# Patient Record
Sex: Male | Born: 1980
Health system: Southern US, Community
[De-identification: ages and names within clinical notes are randomized; demographics above are authoritative.]

## PROBLEM LIST (undated history)

## (undated) DIAGNOSIS — F419 Anxiety disorder, unspecified: Secondary | ICD-10-CM

## (undated) DIAGNOSIS — M549 Dorsalgia, unspecified: Secondary | ICD-10-CM

## (undated) DIAGNOSIS — F909 Attention-deficit hyperactivity disorder, unspecified type: Secondary | ICD-10-CM

## (undated) DIAGNOSIS — K219 Gastro-esophageal reflux disease without esophagitis: Secondary | ICD-10-CM

## (undated) DIAGNOSIS — A4902 Methicillin resistant Staphylococcus aureus infection, unspecified site: Secondary | ICD-10-CM

## (undated) HISTORY — PX: GASTRIC BYPASS: SHX52

## (undated) HISTORY — PX: LAPAROSCOPIC GASTRIC BANDING: SHX1100

## (undated) HISTORY — PX: HERNIA REPAIR: SHX51

## (undated) HISTORY — PX: ABDOMINAL SURGERY: SHX537

---
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
## 2010-02-01 ENCOUNTER — Emergency Department (HOSPITAL_BASED_OUTPATIENT_CLINIC_OR_DEPARTMENT_OTHER): Admission: EM | Admit: 2010-02-01 | Discharge: 2010-02-02 | Payer: Self-pay | Admitting: Emergency Medicine

## 2012-12-11 ENCOUNTER — Encounter (HOSPITAL_BASED_OUTPATIENT_CLINIC_OR_DEPARTMENT_OTHER): Payer: Self-pay | Admitting: *Deleted

## 2012-12-11 ENCOUNTER — Emergency Department (HOSPITAL_BASED_OUTPATIENT_CLINIC_OR_DEPARTMENT_OTHER)
Admission: EM | Admit: 2012-12-11 | Discharge: 2012-12-11 | Disposition: A | Discharge status: Home / Self Care | Payer: BC Managed Care – PPO | Attending: Emergency Medicine | Admitting: Emergency Medicine

## 2012-12-11 DIAGNOSIS — IMO0001 Reserved for inherently not codable concepts without codable children: Secondary | ICD-10-CM | POA: Insufficient documentation

## 2012-12-11 DIAGNOSIS — M25559 Pain in unspecified hip: Secondary | ICD-10-CM | POA: Insufficient documentation

## 2012-12-11 DIAGNOSIS — R509 Fever, unspecified: Secondary | ICD-10-CM | POA: Insufficient documentation

## 2012-12-11 DIAGNOSIS — H53149 Visual discomfort, unspecified: Secondary | ICD-10-CM | POA: Insufficient documentation

## 2012-12-11 DIAGNOSIS — M549 Dorsalgia, unspecified: Secondary | ICD-10-CM | POA: Insufficient documentation

## 2012-12-11 DIAGNOSIS — R000 Tachycardia, unspecified: Secondary | ICD-10-CM | POA: Insufficient documentation

## 2012-12-11 DIAGNOSIS — R5381 Other malaise: Secondary | ICD-10-CM | POA: Insufficient documentation

## 2012-12-11 DIAGNOSIS — R112 Nausea with vomiting, unspecified: Secondary | ICD-10-CM | POA: Insufficient documentation

## 2012-12-11 DIAGNOSIS — R51 Headache: Secondary | ICD-10-CM | POA: Insufficient documentation

## 2012-12-11 LAB — COMPREHENSIVE METABOLIC PANEL
Albumin: 4 g/dL (ref 3.5–5.2)
BUN: 12 mg/dL (ref 6–23)
Chloride: 105 mEq/L (ref 96–112)
Creatinine, Ser: 0.9 mg/dL (ref 0.50–1.35)
GFR calc Af Amer: >90 mL/min (ref >90)
Glucose, Bld: 101 mg/dL — ABNORMAL HIGH (ref 70–99)
Total Bilirubin: 0.5 mg/dL (ref 0.3–1.2)

## 2012-12-11 LAB — CBC WITH DIFFERENTIAL/PLATELET
Basophils Absolute: 0 10*3/uL (ref 0.0–0.1)
Basophils Relative: 0 % (ref 0–1)
Eosinophils Absolute: 0 10*3/uL (ref 0.0–0.7)
Eosinophils Relative: 0 % (ref 0–5)
HCT: 49.4 % (ref 39.0–52.0)
Hemoglobin: 17.2 g/dL — ABNORMAL HIGH (ref 13.0–17.0)
Lymphocytes Relative: 5 % — ABNORMAL LOW (ref 12–46)
Lymphs Abs: 0.8 10*3/uL (ref 0.7–4.0)
MCH: 29.8 pg (ref 26.0–34.0)
MCHC: 34.8 g/dL (ref 30.0–36.0)
MCV: 85.5 fL (ref 78.0–100.0)
Monocytes Absolute: 0.5 10*3/uL (ref 0.1–1.0)
Monocytes Relative: 3 % (ref 3–12)
Neutro Abs: 14.4 10*3/uL — ABNORMAL HIGH (ref 1.7–7.7)
Neutrophils Relative %: 92 % — ABNORMAL HIGH (ref 43–77)
Platelets: 212 10*3/uL (ref 150–400)
RBC: 5.78 MIL/uL (ref 4.22–5.81)
RDW: 13.4 % (ref 11.5–15.5)
WBC: 15.7 10*3/uL — ABNORMAL HIGH (ref 4.0–10.5)

## 2012-12-11 LAB — CG4 I-STAT (LACTIC ACID): Lactic Acid, Venous: 2.67 mmol/L — ABNORMAL HIGH (ref 0.5–2.2)

## 2012-12-11 MED ORDER — ACETAMINOPHEN 500 MG PO TABS
ORAL_TABLET | ORAL | Status: AC
  Administered 2012-12-11: 1000 mg via ORAL
  Filled 2012-12-11: qty 2

## 2012-12-11 MED ORDER — ONDANSETRON HCL 4 MG/2ML IJ SOLN
4.0000 mg | Freq: Once | INTRAMUSCULAR | Status: AC
  Administered 2012-12-11: 4 mg via INTRAVENOUS
  Filled 2012-12-11: qty 2

## 2012-12-11 MED ORDER — ACETAMINOPHEN 500 MG PO TABS
1000.0000 mg | ORAL_TABLET | Freq: Once | ORAL | Status: AC
  Administered 2012-12-11: 1000 mg via ORAL

## 2012-12-11 MED ORDER — SODIUM CHLORIDE 0.9 % IV BOLUS (SEPSIS)
3000.0000 mL | Freq: Once | INTRAVENOUS | Status: AC
  Administered 2012-12-11: 3000 mL via INTRAVENOUS

## 2012-12-11 MED ORDER — ONDANSETRON HCL 4 MG/2ML IJ SOLN
INTRAMUSCULAR | Status: AC
  Administered 2012-12-11: 4 mg via INTRAVENOUS
  Filled 2012-12-11: qty 2

## 2012-12-11 NOTE — ED Notes (Signed)
1000cc NS hung to present IV site.

## 2012-12-11 NOTE — ED Notes (Signed)
Pt appears very anxious, encouraged to slow his resp rate and take slow, deep breaths.

## 2012-12-11 NOTE — ED Notes (Signed)
Pt to room 3 in w/c, able to stand and walk to bed, reports onset of hip pains, back pain, emesis x 4, and generalized body aches and headache x this am.

## 2012-12-11 NOTE — ED Provider Notes (Signed)
History     CSN: [id number]  Arrival date & time 12/11/12  1152   None     Chief Complaint  Patient presents with  . Hip Pain  . Back Pain  . Headache    (Consider location/radiation/quality/duration/timing/severity/associated sxs/prior treatment) HPI Patient developed shaking chills headache diffuse arthralgias and myalgias back pain onset 9:30 AM today. No visual changes no neck stiffness patient vomited 4 times since onset of illness he denies other complaint no abdominal pain no chest pain no urinary symptoms. He admits to generalized weakness he denies recent insect bite denies recent foreign travel no treatment prior to coming here nothing makes symptoms better or worse symptoms associated with generalized weakness. No rash History reviewed. No pertinent past medical history. Medical history negative History reviewed. No pertinent past surgical history. Surgical history lap band History reviewed. No pertinent family history.  History  Substance Use Topics  . Smoking status: Not on file  . Smokeless tobacco: Not on file  . Alcohol Use: Not on file   No tobacco no alcohol no drugs   Review of Systems  Constitutional: Positive for chills.  Eyes: Positive for photophobia.  Respiratory: Negative.   Cardiovascular: Negative.   Gastrointestinal: Positive for nausea and vomiting.  Musculoskeletal: Positive for myalgias and arthralgias.  Skin: Negative.   Neurological: Positive for headaches.  Psychiatric/Behavioral: Negative.   All other systems reviewed and are negative.    Allergies  Review of patient's allergies indicates no known allergies.  Home Medications  No current outpatient prescriptions on file.  BP 109/64  Pulse 120  Temp(Src) 98.4 F (36.9 C) (Oral)  Resp 28  SpO2 100%  Physical Exam  Nursing note and vitals reviewed. Constitutional: He is oriented to person, place, and time. He appears well-developed and well-nourished. No distress.  Mildly  ill-appearing  HENT:  Head: Normocephalic and atraumatic.  Right Ear: External ear normal.  Left Ear: External ear normal.  Bilateral tympanic membranes normal  Eyes: Conjunctivae are normal. Pupils are equal, round, and reactive to light.  Neck: Neck supple. No tracheal deviation present. No thyromegaly present.  No signs of meningitis  Cardiovascular: Regular rhythm.   No murmur heard. Tachycardic  Pulmonary/Chest: Effort normal and breath sounds normal.  Abdominal: Soft. Bowel sounds are normal. He exhibits no distension. There is no tenderness.  Genitourinary: Penis normal.  Musculoskeletal: Normal range of motion. He exhibits no edema and no tenderness.  Lymphadenopathy:    He has no cervical adenopathy.  Neurological: He is alert and oriented to person, place, and time. No cranial nerve deficit. Coordination normal.  Skin: Skin is warm and dry. No rash noted.  Psychiatric: He has a normal mood and affect.    ED Course  Procedures (including critical care time)  Labs Reviewed - No data to display No results found.   No diagnosis found. 1:50 PM patient feels much improved after treatment with intravenous fluids, Zofran and Tylenol. He is no longer nauseated. Pain is much diminished he no longer complains of photophobia. He is alert comfortable appearing, talkative  4 PM patient is alert and appropriate feels much improved only complains of minimal frontal headache Ambulates without difficulty not lightheaded on standing. Results for orders placed during the hospital encounter of 12/11/12  CBC WITH DIFFERENTIAL      Result Value Range   WBC 15.7 (*) 4.0 - 10.5 K/uL   RBC 5.78  4.22 - 5.81 MIL/uL   Hemoglobin 17.2 (*) 13.0 - 17.0 g/dL  HCT 49.4  39.0 - 52.0 %   MCV 85.5  78.0 - 100.0 fL   MCH 29.8  26.0 - 34.0 pg   MCHC 34.8  30.0 - 36.0 g/dL   RDW 16.1  09.6 - 04.5 %   Platelets 212  150 - 400 K/uL   Neutrophils Relative % 92 (*) 43 - 77 %   Neutro Abs 14.4 (*) 1.7  - 7.7 K/uL   Lymphocytes Relative 5 (*) 12 - 46 %   Lymphs Abs 0.8  0.7 - 4.0 K/uL   Monocytes Relative 3  3 - 12 %   Monocytes Absolute 0.5  0.1 - 1.0 K/uL   Eosinophils Relative 0  0 - 5 %   Eosinophils Absolute 0.0  0.0 - 0.7 K/uL   Basophils Relative 0  0 - 1 %   Basophils Absolute 0.0  0.0 - 0.1 K/uL  COMPREHENSIVE METABOLIC PANEL      Result Value Range   Sodium 139  135 - 145 mEq/L   Potassium 4.3  3.5 - 5.1 mEq/L   Chloride 105  96 - 112 mEq/L   CO2 24  19 - 32 mEq/L   Glucose, Bld 101 (*) 70 - 99 mg/dL   BUN 12  6 - 23 mg/dL   Creatinine, Ser 4.09  0.50 - 1.35 mg/dL   Calcium 81.1  8.4 - 91.4 mg/dL   Total Protein 7.7  6.0 - 8.3 g/dL   Albumin 4.0  3.5 - 5.2 g/dL   AST 24  0 - 37 U/L   ALT 20  0 - 53 U/L   Alkaline Phosphatase 64  39 - 117 U/L   Total Bilirubin 0.5  0.3 - 1.2 mg/dL   GFR calc non Af Amer >90  >90 mL/min   GFR calc Af Amer >90  >90 mL/min  CG4 I-STAT (LACTIC ACID)      Result Value Range   Lactic Acid, Venous 2.67 (*) 0.5 - 2.2 mmol/L   No results found.  MDM  Assessment doubt meningitis no meningeal signs feels much improved with intravenous fluids. Plan encourage oral hydration, Tylenol for fever every 4 hours as needed Return if feels worse in 12 hours Diagnosis febrile illness        Doug Sou, MD 12/11/12 1609

## 2013-04-13 ENCOUNTER — Encounter (HOSPITAL_COMMUNITY): Payer: Self-pay | Admitting: *Deleted

## 2013-04-13 ENCOUNTER — Emergency Department (HOSPITAL_COMMUNITY)
Admission: EM | Admit: 2013-04-13 | Discharge: 2013-04-13 | Disposition: A | Discharge status: Home / Self Care | Payer: BC Managed Care – PPO | Attending: Emergency Medicine | Admitting: Emergency Medicine

## 2013-04-13 ENCOUNTER — Emergency Department (HOSPITAL_COMMUNITY): Payer: BC Managed Care – PPO

## 2013-04-13 DIAGNOSIS — K625 Hemorrhage of anus and rectum: Secondary | ICD-10-CM | POA: Insufficient documentation

## 2013-04-13 DIAGNOSIS — Z87891 Personal history of nicotine dependence: Secondary | ICD-10-CM | POA: Insufficient documentation

## 2013-04-13 DIAGNOSIS — R1032 Left lower quadrant pain: Secondary | ICD-10-CM | POA: Insufficient documentation

## 2013-04-13 LAB — COMPREHENSIVE METABOLIC PANEL
ALT: 18 U/L (ref 0–53)
AST: 18 U/L (ref 0–37)
Albumin: 4 g/dL (ref 3.5–5.2)
Alkaline Phosphatase: 67 U/L (ref 39–117)
BUN: 12 mg/dL (ref 6–23)
Chloride: 103 mEq/L (ref 96–112)
Potassium: 3.9 mEq/L (ref 3.5–5.1)
Sodium: 140 mEq/L (ref 135–145)
Total Bilirubin: 0.4 mg/dL (ref 0.3–1.2)
Total Protein: 7.7 g/dL (ref 6.0–8.3)

## 2013-04-13 LAB — CBC
HCT: 48.3 % (ref 39.0–52.0)
MCHC: 34.2 g/dL (ref 30.0–36.0)
Platelets: 143 10*3/uL — ABNORMAL LOW (ref 150–400)
RDW: 13.1 % (ref 11.5–15.5)
WBC: 7.6 10*3/uL (ref 4.0–10.5)

## 2013-04-13 LAB — OCCULT BLOOD, POC DEVICE: Fecal Occult Bld: POSITIVE — AB

## 2013-04-13 MED ORDER — SODIUM CHLORIDE 0.9 % IV BOLUS (SEPSIS)
1000.0000 mL | Freq: Once | INTRAVENOUS | Status: AC
  Administered 2013-04-13: 1000 mL via INTRAVENOUS

## 2013-04-13 MED ORDER — MORPHINE SULFATE 4 MG/ML IJ SOLN
4.0000 mg | Freq: Once | INTRAMUSCULAR | Status: AC
  Administered 2013-04-13: 4 mg via INTRAVENOUS
  Filled 2013-04-13: qty 1

## 2013-04-13 MED ORDER — IOHEXOL 300 MG/ML  SOLN
100.0000 mL | Freq: Once | INTRAMUSCULAR | Status: AC | PRN
  Administered 2013-04-13: 100 mL via INTRAVENOUS

## 2013-04-13 MED ORDER — IOHEXOL 300 MG/ML  SOLN
25.0000 mL | INTRAMUSCULAR | Status: AC
  Administered 2013-04-13: 25 mL via ORAL

## 2013-04-13 NOTE — ED Notes (Signed)
MD at bedside. 

## 2013-04-13 NOTE — ED Provider Notes (Signed)
CSN: [id number]     Arrival date & time 04/13/13  1557 History   First MD Initiated Contact with Patient 04/13/13 1934     Chief Complaint  Patient presents with  . Rectal Bleeding   (Consider location/radiation/quality/duration/timing/severity/associated sxs/prior Treatment) HPI Comments: The patient is a 32 year old male who presents with left lower quadrant abdominal pain as well as bright red blood per rectum. Patient states that he bit into a metal piece of a fryer pain at a restaurant on Saturday evening. He was seen at an outside hospital where tetanus was updated and small amount of bleeding intraorally was stopped. Since that time, he has had progressively worsening bright red blood per rectum. He was seen by his PCP today who noted bright red blood as well as Hemoccult positive. He was referred to GI, however that appointment is scheduled for tomorrow. He began to have left lower quadrant pain following that visit, and presents to the ED due to instructions to present with worsening pain.  Patient is a 32 y.o. male presenting with hematochezia.  Rectal Bleeding Quality:  Bright red Amount:  Copious Duration:  3 days Timing:  Intermittent Progression:  Worsening Chronicity:  New Context: foreign body (possible metal object swallowed)   Relieved by:  Nothing Worsened by:  Nothing tried Ineffective treatments:  None tried Associated symptoms: abdominal pain   Associated symptoms: no fever, no hematemesis, no light-headedness, no loss of consciousness, no recent illness and no vomiting     History reviewed. No pertinent past medical history. Past Surgical History  Procedure Laterality Date  . Laparoscopic gastric banding    . Hernia repair     No family history on file. History  Substance Use Topics  . Smoking status: Former Games developer  . Smokeless tobacco: Not on file  . Alcohol Use: Yes     Comment: occ    Review of Systems  Constitutional: Negative for fever.   Gastrointestinal: Positive for abdominal pain and hematochezia. Negative for vomiting and hematemesis.  Neurological: Negative for loss of consciousness and light-headedness.  All other systems reviewed and are negative.    Allergies  Review of patient's allergies indicates no known allergies.  Home Medications  No current outpatient prescriptions on file. BP 112/75  Pulse 70  Temp(Src) 98.1 F (36.7 C) (Oral)  Resp 18  Wt 297 lb 9 oz (134.973 kg)  SpO2 97% Physical Exam  Nursing note and vitals reviewed. Constitutional: He is oriented to person, place, and time. He appears well-developed and well-nourished. No distress.  HENT:  Head: Normocephalic and atraumatic.  Mouth/Throat: Oropharynx is clear and moist. No oropharyngeal exudate.  Eyes: Conjunctivae and EOM are normal. Pupils are equal, round, and reactive to light.  Neck: Normal range of motion. Neck supple.  Cardiovascular: Normal rate, regular rhythm, normal heart sounds and intact distal pulses.  Exam reveals no gallop and no friction rub.   No murmur heard. Pulmonary/Chest: Effort normal and breath sounds normal. No respiratory distress. He has no wheezes. He has no rales.  Abdominal: Soft. He exhibits no distension and no mass. There is tenderness (mild) in the left lower quadrant. There is no rebound and no guarding.  obese  Genitourinary:  No external hemorrhoids. No fissures. No gross blood per rectum. Hemoccult-positive.  Musculoskeletal: Normal range of motion. He exhibits no edema and no tenderness.  Lymphadenopathy:    He has no cervical adenopathy.  Neurological: He is alert and oriented to person, place, and time. No cranial nerve deficit.  Skin: Skin is warm and dry. No rash noted. He is not diaphoretic.  Psychiatric: He has a normal mood and affect. His behavior is normal. Judgment and thought content normal.    ED Course  Procedures (including critical care time) Labs Review Labs Reviewed  CBC -  Abnormal; Notable for the following:    Platelets 143 (*)    All other components within normal limits  OCCULT BLOOD, POC DEVICE - Abnormal; Notable for the following:    Fecal Occult Bld POSITIVE (*)    All other components within normal limits  COMPREHENSIVE METABOLIC PANEL  TYPE AND SCREEN  ABO/RH   Imaging Review Dg Abd 1 View  04/13/2013   *RADIOLOGY REPORT*  Clinical Data: Initial encounter for abdominal pain and rectal bleeding.  Possible metallic foreign body ingestion 2 days ago. Prior laparoscopic band procedure.  ABDOMEN - 1 VIEW  Comparison: CT abdomen pelvis earlier same date.  Findings: Bowel gas pattern unremarkable without evidence of obstruction or significant ileus.  Subcutaneous port and catheter for the indwelling laparoscopic band.  The upper abdomen was not included on this image, though the band appeared appropriately oriented on the CT scout image.  Contrast material within the renal collecting systems, ureters, and bladder, all normal in appearance. Regional skeleton unremarkable.  No unexpected metallic foreign body overlies the visualized abdomen.  IMPRESSION: No acute abdominal abnormality.  No unexpected metallic foreign body.   Original Report Authenticated By: Hulan Saas, M.D.   Ct Abdomen Pelvis W Contrast  04/13/2013   CLINICAL DATA:  Questionable history of swallowing a piece of metal Saturday October 4th currently with bleeding per rectum and left lower quadrant abdominal pain  EXAM: CT ABDOMEN AND PELVIS WITH CONTRAST  TECHNIQUE: Multidetector CT imaging of the abdomen and pelvis was performed using the standard protocol following bolus administration of intravenous contrast.  CONTRAST:  OMNIPAQUE IOHEXOL 300 MG/ML  SOLN  COMPARISON:  None.  FINDINGS: Visualized lung bases are clear. No acute musculoskeletal abnormalities.  Small hiatal hernia. Gastric bypass surgery changes noted, with catheter extending inferiorly to the peritoneal cavity crossing the  abdominal wall into the subcutaneous soft tissues of the right upper quadrant where there is a port attached.  Liver is normal. There are lucencies within the gallbladder suggesting non calcified stones. There is no pericholecystic inflammatory change. Spleen is normal. Pancreas is normal. Adrenal glands are normal. Kidneys are normal. Aorta is nondilated with no significant atherosclerotic change.  Reproductive organs are normal. Bladder is normal. Appendix is normal. There is no free air or fluid. There is no perirectal inflammatory change.  IMPRESSION: 1. Numerous small gallstones 2. No free air or fluid in the abdomen or pelvis. No acute findings.   Electronically Signed   By: Esperanza Heir M.D.   On: 04/13/2013 20:46    MDM   1. Rectal bleeding     The patient is a 32 year old male who presents with left lower quadrant abdominal pain as well as bright red blood per rectum. Patient states that he bit into a metal piece of a fryer pain at a restaurant on Saturday evening. He was seen at an outside hospital where tetanus was updated and small amount of bleeding intraorally was stopped. Since that time, he has had progressively worsening bright red blood per rectum. He was seen by his PCP today who noted bright red blood as well as Hemoccult positive. He was referred to GI, however that appointment is scheduled for tomorrow. He began to  have left lower quadrant pain following that visit, and presents to the ED due to instructions to present with worsening pain.  On arrival patient is afebrile and hemodynamically stable. Point tenderness in the left lower quadrant but not peritoneal. No blood noted on rectal exam. Hemoccult positive. Concern for possible bowel injury due to history of possible swallowed metallic object. Additionally, internal hemorrhoids is on the differential. Will evaluate with labs, type and screen, KUB, CT of the abdomen and pelvis. Pain control and fluids given in the ED.  KUB  shows no foreign body, no free air. CT scan shows no source of bleeding, no perforation, no foreign bodies. Lab results without leukocytosis or anemia. No significant lyte abnormality. Based on reassuring findings, feel the foreign body or colon perforation unlikely in this patient. Abdominal exam remains benign. Discussed with patient importance of GI followup is already scheduled for tomorrow and he voiced agreement. No episodes of red blood per rectum during emergency department stay.  Discussed with the patient return precautions and need for follow up with GI. Patient voiced understanding. Stable for d/c. This patient was discussed with my attending, Dr. Jeraldine Loots.    Dorna Leitz, MD 04/14/13 [phone]

## 2013-04-13 NOTE — ED Notes (Signed)
Pt states Saturday bite into metal and did not think swallowed it.  Pt states started having rectal bleeding Saturday nite and states it is bright red and in large amounts.  Pt has LLQ abdominal pain

## 2013-04-14 LAB — ABO/RH: ABO/RH(D): B POS

## 2013-04-14 NOTE — ED Provider Notes (Signed)
This patient was seen in conjunction with the Resident Physician, Dr. Durward Fortes.  I have seen the relevant studies (and ECG as performed) and agree with the interpretation.  The documentation is accurate, and reflects my interpretation of the encounter.    Gerhard Munch, MD 04/14/13 [phone]

## 2013-06-22 ENCOUNTER — Other Ambulatory Visit (HOSPITAL_COMMUNITY): Payer: Self-pay | Admitting: Gastroenterology

## 2013-06-22 DIAGNOSIS — R1110 Vomiting, unspecified: Secondary | ICD-10-CM

## 2013-06-25 DIAGNOSIS — K9509 Other complications of gastric band procedure: Secondary | ICD-10-CM | POA: Insufficient documentation

## 2013-06-26 ENCOUNTER — Encounter (HOSPITAL_COMMUNITY): Payer: BC Managed Care – PPO

## 2013-07-03 ENCOUNTER — Encounter (HOSPITAL_COMMUNITY): Payer: BC Managed Care – PPO

## 2014-01-25 ENCOUNTER — Other Ambulatory Visit (HOSPITAL_COMMUNITY): Payer: Self-pay | Admitting: Gastroenterology

## 2014-01-25 DIAGNOSIS — R1115 Cyclical vomiting syndrome unrelated to migraine: Secondary | ICD-10-CM

## 2014-02-04 ENCOUNTER — Ambulatory Visit (HOSPITAL_COMMUNITY)
Admission: RE | Admit: 2014-02-04 | Discharge: 2014-02-04 | Disposition: A | Discharge status: Home / Self Care | Payer: BC Managed Care – PPO | Source: Ambulatory Visit | Attending: Gastroenterology | Admitting: Gastroenterology

## 2014-02-04 DIAGNOSIS — Z9884 Bariatric surgery status: Secondary | ICD-10-CM | POA: Insufficient documentation

## 2014-02-04 DIAGNOSIS — R1115 Cyclical vomiting syndrome unrelated to migraine: Secondary | ICD-10-CM | POA: Insufficient documentation

## 2014-02-04 MED ORDER — TECHNETIUM TC 99M SULFUR COLLOID
5.5000 | Freq: Once | INTRAVENOUS | Status: AC | PRN
  Administered 2014-02-04: 5.5 via INTRAVENOUS

## 2015-02-10 ENCOUNTER — Other Ambulatory Visit: Payer: Self-pay | Admitting: Surgical Oncology

## 2015-02-10 DIAGNOSIS — Z01818 Encounter for other preprocedural examination: Secondary | ICD-10-CM

## 2015-02-14 ENCOUNTER — Ambulatory Visit
Admission: RE | Admit: 2015-02-14 | Discharge: 2015-02-14 | Disposition: A | Discharge status: Home / Self Care | Payer: Self-pay | Source: Ambulatory Visit | Attending: Surgical Oncology | Admitting: Surgical Oncology

## 2015-02-14 DIAGNOSIS — Z01818 Encounter for other preprocedural examination: Secondary | ICD-10-CM

## 2016-06-01 ENCOUNTER — Emergency Department (HOSPITAL_COMMUNITY)
Admission: EM | Admit: 2016-06-01 | Discharge: 2016-06-01 | Disposition: A | Discharge status: Home / Self Care | Payer: BLUE CROSS/BLUE SHIELD | Attending: Emergency Medicine | Admitting: Emergency Medicine

## 2016-06-01 ENCOUNTER — Emergency Department (HOSPITAL_COMMUNITY): Payer: BLUE CROSS/BLUE SHIELD

## 2016-06-01 ENCOUNTER — Encounter (HOSPITAL_COMMUNITY): Payer: Self-pay | Admitting: Emergency Medicine

## 2016-06-01 DIAGNOSIS — Z87891 Personal history of nicotine dependence: Secondary | ICD-10-CM | POA: Insufficient documentation

## 2016-06-01 DIAGNOSIS — R1031 Right lower quadrant pain: Secondary | ICD-10-CM | POA: Diagnosis not present

## 2016-06-01 DIAGNOSIS — Z79899 Other long term (current) drug therapy: Secondary | ICD-10-CM | POA: Insufficient documentation

## 2016-06-01 DIAGNOSIS — R109 Unspecified abdominal pain: Secondary | ICD-10-CM

## 2016-06-01 LAB — URINALYSIS, ROUTINE W REFLEX MICROSCOPIC
Bilirubin Urine: NEGATIVE
GLUCOSE, UA: 250 mg/dL — AB
Hgb urine dipstick: NEGATIVE
Ketones, ur: NEGATIVE mg/dL
LEUKOCYTES UA: NEGATIVE
NITRITE: NEGATIVE
PROTEIN: NEGATIVE mg/dL
Specific Gravity, Urine: 1.023 (ref 1.005–1.030)
pH: 6 (ref 5.0–8.0)

## 2016-06-01 LAB — COMPREHENSIVE METABOLIC PANEL
ALT: 14 U/L — ABNORMAL LOW (ref 17–63)
AST: 20 U/L (ref 15–41)
Albumin: 4.4 g/dL (ref 3.5–5.0)
Alkaline Phosphatase: 83 U/L (ref 38–126)
Anion gap: 6 (ref 5–15)
BUN: 14 mg/dL (ref 6–20)
CHLORIDE: 107 mmol/L (ref 101–111)
CO2: 24 mmol/L (ref 22–32)
CREATININE: 0.73 mg/dL (ref 0.61–1.24)
Calcium: 9.5 mg/dL (ref 8.9–10.3)
Glucose, Bld: 120 mg/dL — ABNORMAL HIGH (ref 65–99)
POTASSIUM: 3.5 mmol/L (ref 3.5–5.1)
Sodium: 137 mmol/L (ref 135–145)
TOTAL PROTEIN: 7.4 g/dL (ref 6.5–8.1)
Total Bilirubin: 0.8 mg/dL (ref 0.3–1.2)

## 2016-06-01 LAB — CBC
HCT: 45.5 % (ref 39.0–52.0)
Hemoglobin: 15.8 g/dL (ref 13.0–17.0)
MCH: 30.3 pg (ref 26.0–34.0)
MCHC: 34.7 g/dL (ref 30.0–36.0)
MCV: 87.3 fL (ref 78.0–100.0)
PLATELETS: 260 10*3/uL (ref 150–400)
RBC: 5.21 MIL/uL (ref 4.22–5.81)
RDW: 13.2 % (ref 11.5–15.5)
WBC: 10.8 10*3/uL — AB (ref 4.0–10.5)

## 2016-06-01 LAB — LIPASE, BLOOD: LIPASE: 33 U/L (ref 11–51)

## 2016-06-01 MED ORDER — ACETAMINOPHEN 500 MG PO TABS: 1000.0000 mg | ORAL_TABLET | Freq: Once | ORAL | Status: DC

## 2016-06-01 MED ORDER — GI COCKTAIL ~~LOC~~
30.0000 mL | Freq: Once | ORAL | Status: AC
  Administered 2016-06-01: 30 mL via ORAL
  Filled 2016-06-01: qty 30

## 2016-06-01 MED ORDER — SODIUM CHLORIDE 0.9 % IJ SOLN
INTRAMUSCULAR | Status: AC
  Filled 2016-06-01: qty 50

## 2016-06-01 MED ORDER — IOPAMIDOL (ISOVUE-300) INJECTION 61%
100.0000 mL | Freq: Once | INTRAVENOUS | Status: AC | PRN
  Administered 2016-06-01: 100 mL via INTRAVENOUS

## 2016-06-01 MED ORDER — FENTANYL CITRATE (PF) 100 MCG/2ML IJ SOLN
50.0000 ug | Freq: Once | INTRAMUSCULAR | Status: AC
  Administered 2016-06-01: 50 ug via INTRAVENOUS
  Filled 2016-06-01: qty 2

## 2016-06-01 MED ORDER — IOPAMIDOL (ISOVUE-300) INJECTION 61%
INTRAVENOUS | Status: AC
  Filled 2016-06-01: qty 100

## 2016-06-01 MED ORDER — ONDANSETRON 4 MG PO TBDP
4.0000 mg | ORAL_TABLET | Freq: Once | ORAL | Status: AC | PRN
  Administered 2016-06-01: 4 mg via ORAL
  Filled 2016-06-01: qty 1

## 2016-06-01 MED ORDER — SODIUM CHLORIDE 0.9 % IV BOLUS (SEPSIS)
1000.0000 mL | Freq: Once | INTRAVENOUS | Status: AC
  Administered 2016-06-01: 1000 mL via INTRAVENOUS

## 2016-06-01 MED ORDER — GI COCKTAIL ~~LOC~~: 30.0000 mL | Freq: Once | ORAL | Status: DC

## 2016-06-01 NOTE — ED Triage Notes (Signed)
Pt c/o abdominal pani and nausea starting last night an hour after eating. Pt states tried gas medication with no relief. Describes abdominal pain as radiating from bottom right to over abs to left side. Constant pain that comes with severe waves. No vomiting or diarrhea. Passing gas. Bowel sounds audible. Pt hx of gastric bypass.

## 2016-06-01 NOTE — ED Provider Notes (Signed)
[facility]-EMERGENCY DEPT Provider Note   CSN: [id number] Arrival date & time: 06/01/16  1435     History   Chief Complaint Chief Complaint  Patient presents with  . Abdominal Pain    HPI Robert Hess is a 35 y.o. male.  The history is provided by the patient.  Abdominal Pain   This is a new problem. The current episode started yesterday. The problem occurs constantly (fluctuating). The problem has not changed since onset.The pain is associated with eating (one hour post prandial). The pain is located in the RUQ and RLQ. The pain is moderate. Associated symptoms include nausea. Pertinent negatives include fever, diarrhea, vomiting, constipation, dysuria, frequency, hematuria and arthralgias. The symptoms are aggravated by eating, palpation, certain positions and activity. The symptoms are relieved by being still.   S/p cholecystectomy last year. No known h/o PUD. Remote h/o GERD now resolved. No melena. No sick contacts, travels.   History reviewed. No pertinent past medical history.  There are no active problems to display for this patient.   Past Surgical History:  Procedure Laterality Date  . HERNIA REPAIR    . LAPAROSCOPIC GASTRIC BANDING         Home Medications    Prior to Admission medications   Medication Sig Start Date End Date Taking? Authorizing Provider  acetaminophen (TYLENOL) 500 MG tablet Take 1,000 mg by mouth every 6 (six) hours as needed for mild pain, moderate pain, fever or headache.   Yes Historical Provider, MD  ibuprofen (ADVIL,MOTRIN) 200 MG tablet Take 400 mg by mouth every 6 (six) hours as needed for fever, headache, mild pain, moderate pain or cramping.   Yes Historical Provider, MD  lansoprazole (PREVACID) 30 MG capsule Take 30 mg by mouth daily.   Yes Historical Provider, MD    Family History No family history on file.  Social History Social History  Substance Use Topics  . Smoking status: Former Games developermoker  . Smokeless tobacco: Never Used    . Alcohol use Yes     Comment: occ     Allergies   Other and Oxycodone   Review of Systems Review of Systems  Constitutional: Negative for chills and fever.  HENT: Negative for ear pain and sore throat.   Eyes: Negative for pain and visual disturbance.  Respiratory: Negative for cough and shortness of breath.   Cardiovascular: Negative for chest pain and palpitations.  Gastrointestinal: Positive for abdominal pain and nausea. Negative for constipation, diarrhea and vomiting.  Genitourinary: Negative for dysuria, frequency and hematuria.  Musculoskeletal: Negative for arthralgias and back pain.  Skin: Negative for color change and rash.  Neurological: Negative for seizures and syncope.  All other systems reviewed and are negative.    Physical Exam Updated Vital Signs BP 121/92 (BP Location: Right Arm)   Pulse 69   Temp 97.8 F (36.6 C) (Oral)   Resp 18   Ht 6\' 3"  (1.905 m)   Wt 245 lb (111.1 kg)   SpO2 100%   BMI 30.62 kg/m   Physical Exam  Constitutional: He is oriented to person, place, and time. He appears well-developed and well-nourished. No distress.  HENT:  Head: Normocephalic and atraumatic.  Nose: Nose normal.  Eyes: Conjunctivae and EOM are normal. Pupils are equal, round, and reactive to light. Right eye exhibits no discharge. Left eye exhibits no discharge. No scleral icterus.  Neck: Normal range of motion. Neck supple.  Cardiovascular: Normal rate and regular rhythm.  Exam reveals no gallop and no  friction rub.   No murmur heard. Pulmonary/Chest: Effort normal and breath sounds normal. No stridor. No respiratory distress. He has no rales.  Abdominal: Soft. He exhibits no distension. There is tenderness in the right lower quadrant. There is tenderness at McBurney's point. There is no rigidity, no rebound, no guarding and no CVA tenderness.  +psoas  Musculoskeletal: He exhibits no edema or tenderness.  Neurological: He is alert and oriented to person,  place, and time.  Skin: Skin is warm and dry. No rash noted. He is not diaphoretic. No erythema.  Psychiatric: He has a normal mood and affect.  Vitals reviewed.    ED Treatments / Results  Labs (all labs ordered are listed, but only abnormal results are displayed) Labs Reviewed  COMPREHENSIVE METABOLIC PANEL - Abnormal; Notable for the following:       Result Value   Glucose, Bld 120 (*)    ALT 14 (*)    All other components within normal limits  CBC - Abnormal; Notable for the following:    WBC 10.8 (*)    All other components within normal limits  URINALYSIS, ROUTINE W REFLEX MICROSCOPIC (NOT AT [facility]) - Abnormal; Notable for the following:    Glucose, UA 250 (*)    All other components within normal limits  LIPASE, BLOOD    EKG  EKG Interpretation None       Radiology Ct Abdomen Pelvis W Contrast  Result Date: 06/01/2016 CLINICAL DATA:  Acute generalized abdominal pain. EXAM: CT ABDOMEN AND PELVIS WITH CONTRAST TECHNIQUE: Multidetector CT imaging of the abdomen and pelvis was performed using the standard protocol following bolus administration of intravenous contrast. CONTRAST:  ISOVUE-300 IOPAMIDOL (ISOVUE-300) INJECTION 61% COMPARISON:  CT scan of April 13, 2013. FINDINGS: Lower chest: Visualized lung bases are unremarkable. Hepatobiliary: Status post cholecystectomy.  Normal liver. Pancreas: Unremarkable. No pancreatic ductal dilatation or surrounding inflammatory changes. Spleen: Normal in size without focal abnormality. Adrenals/Urinary Tract: Adrenal glands are unremarkable. Kidneys are normal, without renal calculi, focal lesion, or hydronephrosis. Bladder is unremarkable. Stomach/Bowel: Normal appendix. Status post gastric bypass. There is no evidence bowel obstruction. Vascular/Lymphatic: No significant adenopathy is noted. Reproductive: Normal prostate. Other: Small amount of free fluid is noted in dependent portion of pelvis. Musculoskeletal: No significant  osseous abnormality is noted. IMPRESSION: Status post gastric bypass and cholecystectomy. Small amount of free fluid is noted in dependent portion of pelvis. No other definite abnormality seen in the abdomen or pelvis. Electronically Signed   By: Lupita Raider, M.D.   On: 06/01/2016 20:19    Procedures Procedures (including critical care time)  Medications Ordered in ED Medications  iopamidol (ISOVUE-300) 61 % injection (not administered)  sodium chloride 0.9 % injection (not administered)  ondansetron (ZOFRAN-ODT) disintegrating tablet 4 mg (4 mg Oral Given 06/01/16 1503)  fentaNYL (SUBLIMAZE) injection 50 mcg (50 mcg Intravenous Given 06/01/16 2027)  sodium chloride 0.9 % bolus 1,000 mL (1,000 mLs Intravenous New Bag/Given 06/01/16 2028)  iopamidol (ISOVUE-300) 61 % injection 100 mL (100 mLs Intravenous Contrast Given 06/01/16 1950)  gi cocktail (Maalox,Lidocaine,Donnatal) (30 mLs Oral Given 06/01/16 2154)     Initial Impression / Assessment and Plan / ED Course  I have reviewed the triage vital signs and the nursing notes.  Pertinent labs & imaging results that were available during my care of the patient were reviewed by me and considered in my medical decision making (see chart for details).  Clinical Course     No evidence of appendicitis,  internal hernia, small bowel obstruction, or other intra-abdominal inflammatory infectious processes. Labs grossly reassuring. Patient able to tolerate by mouth. Safe for discharge with strict return precautions and close [facility].    Final Clinical Impressions(s) / ED Diagnoses   Final diagnoses:  Abdominal pain  Right lower quadrant abdominal pain   Disposition: Discharge  Condition: Good  I have discussed the results, Dx and Tx plan with the patient who expressed understanding and agree(s) with the plan. Discharge instructions discussed at great length. The patient was given strict return  precautions who verbalized understanding of the instructions. No further questions at time of discharge.    Current Discharge Medication List      Follow Up: Georgann HousekeeperKarrar Husain, MD [address] [place] [address] [phone]  Schedule an appointment as soon as possible for a visit  As needed  [facility]  Schedule an appointment as soon as possible for a visit  in 3-5 days, If symptoms do not improve or  worsen      Nira ConnPedro Eduardo Cardama, MD 06/01/16 2220

## 2016-06-01 NOTE — ED Notes (Signed)
Patient transported to CT 

## 2017-04-09 DIAGNOSIS — M545 Low back pain: Secondary | ICD-10-CM | POA: Diagnosis not present

## 2017-11-15 DIAGNOSIS — G960 Cerebrospinal fluid leak: Secondary | ICD-10-CM | POA: Diagnosis not present

## 2017-11-15 DIAGNOSIS — G44229 Chronic tension-type headache, not intractable: Secondary | ICD-10-CM | POA: Diagnosis not present

## 2017-12-31 DIAGNOSIS — M5416 Radiculopathy, lumbar region: Secondary | ICD-10-CM | POA: Diagnosis not present

## 2017-12-31 DIAGNOSIS — M545 Low back pain: Secondary | ICD-10-CM | POA: Diagnosis not present

## 2017-12-31 DIAGNOSIS — M5126 Other intervertebral disc displacement, lumbar region: Secondary | ICD-10-CM | POA: Diagnosis not present

## 2017-12-31 DIAGNOSIS — S335XXA Sprain of ligaments of lumbar spine, initial encounter: Secondary | ICD-10-CM | POA: Diagnosis not present

## 2017-12-31 DIAGNOSIS — M546 Pain in thoracic spine: Secondary | ICD-10-CM | POA: Diagnosis not present

## 2017-12-31 DIAGNOSIS — S139XXA Sprain of joints and ligaments of unspecified parts of neck, initial encounter: Secondary | ICD-10-CM | POA: Diagnosis not present

## 2018-01-01 DIAGNOSIS — M545 Low back pain: Secondary | ICD-10-CM | POA: Diagnosis not present

## 2018-01-07 DIAGNOSIS — M545 Low back pain: Secondary | ICD-10-CM | POA: Diagnosis not present

## 2018-01-07 DIAGNOSIS — M5416 Radiculopathy, lumbar region: Secondary | ICD-10-CM | POA: Diagnosis not present

## 2018-01-07 DIAGNOSIS — M5126 Other intervertebral disc displacement, lumbar region: Secondary | ICD-10-CM | POA: Diagnosis not present

## 2018-01-10 DIAGNOSIS — M5416 Radiculopathy, lumbar region: Secondary | ICD-10-CM | POA: Diagnosis not present

## 2018-01-10 DIAGNOSIS — M5126 Other intervertebral disc displacement, lumbar region: Secondary | ICD-10-CM | POA: Diagnosis not present

## 2018-01-10 DIAGNOSIS — M545 Low back pain: Secondary | ICD-10-CM | POA: Diagnosis not present

## 2018-01-13 DIAGNOSIS — M545 Low back pain: Secondary | ICD-10-CM | POA: Diagnosis not present

## 2018-01-13 DIAGNOSIS — M5126 Other intervertebral disc displacement, lumbar region: Secondary | ICD-10-CM | POA: Diagnosis not present

## 2018-01-13 DIAGNOSIS — M5416 Radiculopathy, lumbar region: Secondary | ICD-10-CM | POA: Diagnosis not present

## 2018-01-16 DIAGNOSIS — M5416 Radiculopathy, lumbar region: Secondary | ICD-10-CM | POA: Diagnosis not present

## 2018-01-16 DIAGNOSIS — M5126 Other intervertebral disc displacement, lumbar region: Secondary | ICD-10-CM | POA: Diagnosis not present

## 2018-01-16 DIAGNOSIS — M545 Low back pain: Secondary | ICD-10-CM | POA: Diagnosis not present

## 2018-01-21 DIAGNOSIS — M5126 Other intervertebral disc displacement, lumbar region: Secondary | ICD-10-CM | POA: Diagnosis not present

## 2018-01-21 DIAGNOSIS — M5416 Radiculopathy, lumbar region: Secondary | ICD-10-CM | POA: Diagnosis not present

## 2018-01-21 DIAGNOSIS — M545 Low back pain: Secondary | ICD-10-CM | POA: Diagnosis not present

## 2018-01-22 DIAGNOSIS — M545 Low back pain: Secondary | ICD-10-CM | POA: Diagnosis not present

## 2018-01-22 DIAGNOSIS — M5126 Other intervertebral disc displacement, lumbar region: Secondary | ICD-10-CM | POA: Diagnosis not present

## 2018-01-22 DIAGNOSIS — M5416 Radiculopathy, lumbar region: Secondary | ICD-10-CM | POA: Diagnosis not present

## 2018-01-27 DIAGNOSIS — M5416 Radiculopathy, lumbar region: Secondary | ICD-10-CM | POA: Diagnosis not present

## 2018-01-27 DIAGNOSIS — M5126 Other intervertebral disc displacement, lumbar region: Secondary | ICD-10-CM | POA: Diagnosis not present

## 2018-01-27 DIAGNOSIS — M545 Low back pain: Secondary | ICD-10-CM | POA: Diagnosis not present

## 2018-01-30 DIAGNOSIS — M5416 Radiculopathy, lumbar region: Secondary | ICD-10-CM | POA: Diagnosis not present

## 2018-01-30 DIAGNOSIS — M5126 Other intervertebral disc displacement, lumbar region: Secondary | ICD-10-CM | POA: Diagnosis not present

## 2018-01-30 DIAGNOSIS — M545 Low back pain: Secondary | ICD-10-CM | POA: Diagnosis not present

## 2018-01-31 DIAGNOSIS — M545 Low back pain: Secondary | ICD-10-CM | POA: Diagnosis not present

## 2018-01-31 DIAGNOSIS — M5126 Other intervertebral disc displacement, lumbar region: Secondary | ICD-10-CM | POA: Diagnosis not present

## 2018-01-31 DIAGNOSIS — M5416 Radiculopathy, lumbar region: Secondary | ICD-10-CM | POA: Diagnosis not present

## 2018-02-06 DIAGNOSIS — M5416 Radiculopathy, lumbar region: Secondary | ICD-10-CM | POA: Diagnosis not present

## 2018-02-06 DIAGNOSIS — M5126 Other intervertebral disc displacement, lumbar region: Secondary | ICD-10-CM | POA: Diagnosis not present

## 2018-02-06 DIAGNOSIS — M545 Low back pain: Secondary | ICD-10-CM | POA: Diagnosis not present

## 2018-02-10 DIAGNOSIS — M5126 Other intervertebral disc displacement, lumbar region: Secondary | ICD-10-CM | POA: Diagnosis not present

## 2018-02-10 DIAGNOSIS — M5416 Radiculopathy, lumbar region: Secondary | ICD-10-CM | POA: Diagnosis not present

## 2018-02-10 DIAGNOSIS — M545 Low back pain: Secondary | ICD-10-CM | POA: Diagnosis not present

## 2018-02-11 DIAGNOSIS — M545 Low back pain: Secondary | ICD-10-CM | POA: Diagnosis not present

## 2018-02-12 DIAGNOSIS — M545 Low back pain: Secondary | ICD-10-CM | POA: Diagnosis not present

## 2018-02-12 DIAGNOSIS — M5416 Radiculopathy, lumbar region: Secondary | ICD-10-CM | POA: Diagnosis not present

## 2018-02-12 DIAGNOSIS — M5126 Other intervertebral disc displacement, lumbar region: Secondary | ICD-10-CM | POA: Diagnosis not present

## 2018-02-19 DIAGNOSIS — M5416 Radiculopathy, lumbar region: Secondary | ICD-10-CM | POA: Diagnosis not present

## 2018-02-19 DIAGNOSIS — M5126 Other intervertebral disc displacement, lumbar region: Secondary | ICD-10-CM | POA: Diagnosis not present

## 2018-02-19 DIAGNOSIS — M545 Low back pain: Secondary | ICD-10-CM | POA: Diagnosis not present

## 2018-02-25 DIAGNOSIS — M545 Low back pain: Secondary | ICD-10-CM | POA: Diagnosis not present

## 2018-02-25 DIAGNOSIS — M5416 Radiculopathy, lumbar region: Secondary | ICD-10-CM | POA: Diagnosis not present

## 2018-02-25 DIAGNOSIS — M5126 Other intervertebral disc displacement, lumbar region: Secondary | ICD-10-CM | POA: Diagnosis not present

## 2018-03-04 DIAGNOSIS — M5126 Other intervertebral disc displacement, lumbar region: Secondary | ICD-10-CM | POA: Diagnosis not present

## 2018-03-04 DIAGNOSIS — M545 Low back pain: Secondary | ICD-10-CM | POA: Diagnosis not present

## 2018-03-04 DIAGNOSIS — M5416 Radiculopathy, lumbar region: Secondary | ICD-10-CM | POA: Diagnosis not present

## 2018-03-18 DIAGNOSIS — M5416 Radiculopathy, lumbar region: Secondary | ICD-10-CM | POA: Diagnosis not present

## 2018-03-18 DIAGNOSIS — M545 Low back pain: Secondary | ICD-10-CM | POA: Diagnosis not present

## 2018-03-18 DIAGNOSIS — M5126 Other intervertebral disc displacement, lumbar region: Secondary | ICD-10-CM | POA: Diagnosis not present

## 2018-03-25 DIAGNOSIS — F4323 Adjustment disorder with mixed anxiety and depressed mood: Secondary | ICD-10-CM | POA: Diagnosis not present

## 2018-03-25 DIAGNOSIS — F909 Attention-deficit hyperactivity disorder, unspecified type: Secondary | ICD-10-CM | POA: Diagnosis not present

## 2018-03-26 DIAGNOSIS — M5126 Other intervertebral disc displacement, lumbar region: Secondary | ICD-10-CM | POA: Diagnosis not present

## 2018-03-26 DIAGNOSIS — M545 Low back pain: Secondary | ICD-10-CM | POA: Diagnosis not present

## 2018-03-26 DIAGNOSIS — M5416 Radiculopathy, lumbar region: Secondary | ICD-10-CM | POA: Diagnosis not present

## 2018-04-03 DIAGNOSIS — F909 Attention-deficit hyperactivity disorder, unspecified type: Secondary | ICD-10-CM | POA: Diagnosis not present

## 2018-04-03 DIAGNOSIS — F4323 Adjustment disorder with mixed anxiety and depressed mood: Secondary | ICD-10-CM | POA: Diagnosis not present

## 2018-04-11 DIAGNOSIS — F909 Attention-deficit hyperactivity disorder, unspecified type: Secondary | ICD-10-CM | POA: Diagnosis not present

## 2018-04-11 DIAGNOSIS — F4323 Adjustment disorder with mixed anxiety and depressed mood: Secondary | ICD-10-CM | POA: Diagnosis not present

## 2018-04-15 DIAGNOSIS — F411 Generalized anxiety disorder: Secondary | ICD-10-CM | POA: Diagnosis not present

## 2018-04-15 DIAGNOSIS — F4323 Adjustment disorder with mixed anxiety and depressed mood: Secondary | ICD-10-CM | POA: Diagnosis not present

## 2018-04-15 DIAGNOSIS — Z79891 Long term (current) use of opiate analgesic: Secondary | ICD-10-CM | POA: Diagnosis not present

## 2018-05-06 DIAGNOSIS — F4323 Adjustment disorder with mixed anxiety and depressed mood: Secondary | ICD-10-CM | POA: Diagnosis not present

## 2018-05-06 DIAGNOSIS — F909 Attention-deficit hyperactivity disorder, unspecified type: Secondary | ICD-10-CM | POA: Diagnosis not present

## 2018-05-06 DIAGNOSIS — F411 Generalized anxiety disorder: Secondary | ICD-10-CM | POA: Diagnosis not present

## 2018-05-07 DIAGNOSIS — F909 Attention-deficit hyperactivity disorder, unspecified type: Secondary | ICD-10-CM | POA: Diagnosis not present

## 2018-05-07 DIAGNOSIS — F411 Generalized anxiety disorder: Secondary | ICD-10-CM | POA: Diagnosis not present

## 2018-05-07 DIAGNOSIS — F4323 Adjustment disorder with mixed anxiety and depressed mood: Secondary | ICD-10-CM | POA: Diagnosis not present

## 2018-05-13 ENCOUNTER — Telehealth: Payer: Self-pay

## 2018-05-13 NOTE — Telephone Encounter (Signed)
Spoke with patient who has symptoms that he believes are resultant from head injuries he suffered a decade ago playing football. Recommended that he be seen by neurology. Provided him with names of docs at [facility].

## 2018-05-22 DIAGNOSIS — F909 Attention-deficit hyperactivity disorder, unspecified type: Secondary | ICD-10-CM | POA: Diagnosis not present

## 2018-05-22 DIAGNOSIS — F411 Generalized anxiety disorder: Secondary | ICD-10-CM | POA: Diagnosis not present

## 2018-05-22 DIAGNOSIS — F4323 Adjustment disorder with mixed anxiety and depressed mood: Secondary | ICD-10-CM | POA: Diagnosis not present

## 2018-06-03 DIAGNOSIS — F4323 Adjustment disorder with mixed anxiety and depressed mood: Secondary | ICD-10-CM | POA: Diagnosis not present

## 2018-06-03 DIAGNOSIS — F411 Generalized anxiety disorder: Secondary | ICD-10-CM | POA: Diagnosis not present

## 2018-06-03 DIAGNOSIS — F909 Attention-deficit hyperactivity disorder, unspecified type: Secondary | ICD-10-CM | POA: Diagnosis not present

## 2018-06-12 DIAGNOSIS — F909 Attention-deficit hyperactivity disorder, unspecified type: Secondary | ICD-10-CM | POA: Diagnosis not present

## 2018-06-12 DIAGNOSIS — F4323 Adjustment disorder with mixed anxiety and depressed mood: Secondary | ICD-10-CM | POA: Diagnosis not present

## 2018-06-12 DIAGNOSIS — F411 Generalized anxiety disorder: Secondary | ICD-10-CM | POA: Diagnosis not present

## 2018-06-24 DIAGNOSIS — F909 Attention-deficit hyperactivity disorder, unspecified type: Secondary | ICD-10-CM | POA: Diagnosis not present

## 2018-06-24 DIAGNOSIS — F4323 Adjustment disorder with mixed anxiety and depressed mood: Secondary | ICD-10-CM | POA: Diagnosis not present

## 2018-06-27 DIAGNOSIS — F909 Attention-deficit hyperactivity disorder, unspecified type: Secondary | ICD-10-CM | POA: Diagnosis not present

## 2018-06-27 DIAGNOSIS — F4323 Adjustment disorder with mixed anxiety and depressed mood: Secondary | ICD-10-CM | POA: Diagnosis not present

## 2018-08-12 DIAGNOSIS — M545 Low back pain: Secondary | ICD-10-CM | POA: Diagnosis not present

## 2018-11-04 DIAGNOSIS — F909 Attention-deficit hyperactivity disorder, unspecified type: Secondary | ICD-10-CM | POA: Diagnosis not present

## 2018-11-04 DIAGNOSIS — F4323 Adjustment disorder with mixed anxiety and depressed mood: Secondary | ICD-10-CM | POA: Diagnosis not present

## 2018-11-04 DIAGNOSIS — F411 Generalized anxiety disorder: Secondary | ICD-10-CM | POA: Diagnosis not present

## 2018-11-10 DIAGNOSIS — R51 Headache: Secondary | ICD-10-CM | POA: Diagnosis not present

## 2018-11-11 ENCOUNTER — Ambulatory Visit (INDEPENDENT_AMBULATORY_CARE_PROVIDER_SITE_OTHER): Payer: BLUE CROSS/BLUE SHIELD | Admitting: Neurology

## 2018-11-11 ENCOUNTER — Encounter: Payer: Self-pay | Admitting: Neurology

## 2018-11-11 ENCOUNTER — Other Ambulatory Visit: Payer: Self-pay

## 2018-11-11 DIAGNOSIS — G4489 Other headache syndrome: Secondary | ICD-10-CM | POA: Diagnosis not present

## 2018-11-11 DIAGNOSIS — G4486 Cervicogenic headache: Secondary | ICD-10-CM

## 2018-11-11 DIAGNOSIS — R51 Headache: Secondary | ICD-10-CM | POA: Diagnosis not present

## 2018-11-11 DIAGNOSIS — G971 Other reaction to spinal and lumbar puncture: Secondary | ICD-10-CM

## 2018-11-11 DIAGNOSIS — Z5181 Encounter for therapeutic drug level monitoring: Secondary | ICD-10-CM | POA: Diagnosis not present

## 2018-11-11 NOTE — Progress Notes (Signed)
     Virtual Visit via Video Note  I connected with Robert Hess on 11/11/18 at  2:00 PM EDT by a video enabled telemedicine application and verified that I am speaking with the correct person using two identifiers.  Location: Patient: The patient is at home. Provider: Physician in office.   I discussed the limitations of evaluation and management by telemedicine and the availability of in person appointments. The patient expressed understanding and agreed to proceed.  History of Present Illness: Robert Hess is a 38 year old right-handed white male with a history of a chronic headache off and on since 2006.  Along with this, he has had episodes of mainly clear nasal drainage that has been occurring off and on as well.  The patient may go a month or more without any drainage and then have a day or 2 with more significant drainage, always from the right nostril.  He has noted over the years that he has decreased sense of smell.  The patient has had a more severe episode of nasal drainage over the last week.  He provides video of the nasal drainage, the amount of drainage is not severe.  The patient reports that he does get headaches in the back of his head mainly he does have some slight neck stiffness, he has some discomfort when turning the head to the right.  He denies any pain or discomfort down the arms or any numbness or weakness of the face, arms, legs.  He denies any vision complaints or difficulty with balance or difficulty controlling the bowels or the bladder.  He notes that when he lies down the headache is improved some.  The headache is present whether or not he is having sinus drainage.  He denies any fevers or chills.   Observations/Objective: The video evaluation shows that the patient is alert and cooperative.  Face is symmetric.  The patient is able to protrude the tongue in the midline with good lateral movement of the tongue.  He has good finger-nose-finger and heel shin  bilaterally.  Gait is normal.  Tandem gait is normal.  Romberg is negative.  Speech is well enunciated, not aphasic or dysarthric.  Assessment and Plan: 1.  Reported right nasal drainage  2.  Occipital headache, possibly cervicogenic headache  The patient does have some nasal drainage, it is not severe and may be related to allergy symptoms.  The patient will be sent for MRI of the brain with and without gadolinium enhancement, if the spinal fluid leak is present, enhancement of the meninges may be seen.  We will also look at the sinuses on MRI.  We may consider an ENT evaluation in the future.  The use of a nasal spray for allergies may be of some diagnostic value in the future as well.  Follow Up Instructions: Follow-up through this office if needed.   I discussed the assessment and treatment plan with the patient. The patient was provided an opportunity to ask questions and all were answered. The patient agreed with the plan and demonstrated an understanding of the instructions.   The patient was advised to call back or seek an in-person evaluation if the symptoms worsen or if the condition fails to improve as anticipated.  I provided 30 minutes of non-face-to-face time during this encounter.   Robert Spaniel, MD

## 2018-11-12 ENCOUNTER — Other Ambulatory Visit (INDEPENDENT_AMBULATORY_CARE_PROVIDER_SITE_OTHER): Payer: Self-pay

## 2018-11-12 ENCOUNTER — Other Ambulatory Visit: Payer: Self-pay

## 2018-11-12 DIAGNOSIS — Z5181 Encounter for therapeutic drug level monitoring: Secondary | ICD-10-CM

## 2018-11-12 DIAGNOSIS — Z0289 Encounter for other administrative examinations: Secondary | ICD-10-CM

## 2018-11-13 ENCOUNTER — Telehealth: Payer: Self-pay

## 2018-11-13 LAB — COMPREHENSIVE METABOLIC PANEL
ALT: 17 IU/L (ref 0–44)
AST: 24 IU/L (ref 0–40)
Albumin/Globulin Ratio: 1.8 (ref 1.2–2.2)
Albumin: 4.5 g/dL (ref 4.0–5.0)
Alkaline Phosphatase: 95 IU/L (ref 39–117)
BUN/Creatinine Ratio: 16 (ref 9–20)
BUN: 13 mg/dL (ref 6–20)
Bilirubin Total: 0.4 mg/dL (ref 0.0–1.2)
CO2: 21 mmol/L (ref 20–29)
Calcium: 9.8 mg/dL (ref 8.7–10.2)
Chloride: 108 mmol/L — ABNORMAL HIGH (ref 96–106)
Creatinine, Ser: 0.82 mg/dL (ref 0.76–1.27)
GFR calc Af Amer: 131 mL/min/{1.73_m2} (ref >59)
GFR calc non Af Amer: 113 mL/min/{1.73_m2} (ref >59)
Globulin, Total: 2.5 g/dL (ref 1.5–4.5)
Glucose: 70 mg/dL (ref 65–99)
Potassium: 4.1 mmol/L (ref 3.5–5.2)
Sodium: 142 mmol/L (ref 134–144)
Total Protein: 7 g/dL (ref 6.0–8.5)

## 2018-11-13 NOTE — Telephone Encounter (Signed)
I contacted the pt and was able to discuss lab results. Pt verbalized understanding.

## 2018-11-13 NOTE — Telephone Encounter (Signed)
-----   Message from York Spaniel, MD sent at 11/13/2018  7:03 AM EDT ----- Blood work is unremarkable exception of a slightly elevated chloride level, and isolation is not clinically significant.  Renal function is normal, okay for contrast study.  Please call the patient. ----- Message ----- From: Nell Range Lab Results In Sent: 11/13/2018   5:37 AM EDT To: York Spaniel, MD

## 2018-11-18 ENCOUNTER — Telehealth: Payer: Self-pay | Admitting: Neurology

## 2018-11-18 DIAGNOSIS — F909 Attention-deficit hyperactivity disorder, unspecified type: Secondary | ICD-10-CM | POA: Diagnosis not present

## 2018-11-18 DIAGNOSIS — F4323 Adjustment disorder with mixed anxiety and depressed mood: Secondary | ICD-10-CM | POA: Diagnosis not present

## 2018-11-18 DIAGNOSIS — F411 Generalized anxiety disorder: Secondary | ICD-10-CM | POA: Diagnosis not present

## 2018-11-18 NOTE — Telephone Encounter (Signed)
lvm for pt to call back about scheduling mri  [employer] Auth: [id number] (exp. 11/17/18 to 05/15/19)

## 2018-11-26 MED ORDER — ALPRAZOLAM 0.5 MG PO TABS: ORAL_TABLET | ORAL | 0 refills | Status: DC

## 2018-11-26 NOTE — Addendum Note (Signed)
Addended by: York Spaniel on: 11/26/2018 12:43 PM   Modules accepted: Orders

## 2018-11-26 NOTE — Telephone Encounter (Signed)
Low-dose of alprazolam will be sent in.

## 2018-11-26 NOTE — Telephone Encounter (Signed)
Patient returned my call he is scheduled for 12/02/18 at [facility].. he did inform me he is slightly claustrophobic and would like something to help him just incase. He is aware to have a driver.

## 2018-12-02 ENCOUNTER — Other Ambulatory Visit: Payer: Self-pay

## 2018-12-02 ENCOUNTER — Ambulatory Visit (INDEPENDENT_AMBULATORY_CARE_PROVIDER_SITE_OTHER): Payer: BLUE CROSS/BLUE SHIELD

## 2018-12-02 DIAGNOSIS — G4489 Other headache syndrome: Secondary | ICD-10-CM | POA: Diagnosis not present

## 2018-12-02 MED ORDER — GADOBENATE DIMEGLUMINE 529 MG/ML IV SOLN
20.0000 mL | Freq: Once | INTRAVENOUS | Status: AC | PRN
  Administered 2018-12-02: 11:00:00 20 mL via INTRAVENOUS

## 2018-12-04 ENCOUNTER — Telehealth: Payer: Self-pay | Admitting: Neurology

## 2018-12-04 DIAGNOSIS — F4323 Adjustment disorder with mixed anxiety and depressed mood: Secondary | ICD-10-CM | POA: Diagnosis not present

## 2018-12-04 DIAGNOSIS — F909 Attention-deficit hyperactivity disorder, unspecified type: Secondary | ICD-10-CM | POA: Diagnosis not present

## 2018-12-04 DIAGNOSIS — F411 Generalized anxiety disorder: Secondary | ICD-10-CM | POA: Diagnosis not present

## 2018-12-04 NOTE — Telephone Encounter (Signed)
I called the patient.  MRI of the brain is unremarkable.  The patient will try to take Zyrtec for 6 to 8 weeks to see if this alters the sinus drainage.  If not, then we will need to collect fluid and sent for testing, looking for presence of glucose that would indicate spinal fluid.   MRI brain 12/03/18:  IMPRESSION:   Normal MRI brain (with and without).

## 2019-02-26 DIAGNOSIS — M5106 Intervertebral disc disorders with myelopathy, lumbar region: Secondary | ICD-10-CM | POA: Diagnosis not present

## 2019-03-02 DIAGNOSIS — M545 Low back pain: Secondary | ICD-10-CM | POA: Diagnosis not present

## 2019-03-10 DIAGNOSIS — M5416 Radiculopathy, lumbar region: Secondary | ICD-10-CM | POA: Diagnosis not present

## 2019-03-11 ENCOUNTER — Other Ambulatory Visit: Payer: Self-pay | Admitting: Student

## 2019-03-11 ENCOUNTER — Telehealth: Payer: Self-pay | Admitting: Nurse Practitioner

## 2019-03-11 DIAGNOSIS — M5416 Radiculopathy, lumbar region: Secondary | ICD-10-CM

## 2019-03-11 NOTE — Telephone Encounter (Signed)
Phone call to patient to verify medication list and allergies for myelogram procedure. Pt instructed to hold Fluoxetine and Ritalin for 48hrs prior to myelogram appointment time. Pt verbalized understanding. Pre and post procedure instructions reviewed with pt.

## 2019-03-19 ENCOUNTER — Ambulatory Visit
Admission: RE | Admit: 2019-03-19 | Discharge: 2019-03-19 | Disposition: A | Discharge status: Home / Self Care | Payer: BLUE CROSS/BLUE SHIELD | Source: Ambulatory Visit | Attending: Student | Admitting: Student

## 2019-03-19 ENCOUNTER — Other Ambulatory Visit: Payer: Self-pay

## 2019-03-19 DIAGNOSIS — M5126 Other intervertebral disc displacement, lumbar region: Secondary | ICD-10-CM | POA: Diagnosis not present

## 2019-03-19 DIAGNOSIS — M5416 Radiculopathy, lumbar region: Secondary | ICD-10-CM

## 2019-03-19 DIAGNOSIS — M545 Low back pain: Secondary | ICD-10-CM | POA: Diagnosis not present

## 2019-03-19 MED ORDER — IOPAMIDOL (ISOVUE-M 200) INJECTION 41%
20.0000 mL | Freq: Once | INTRAMUSCULAR | Status: AC
  Administered 2019-03-19: 20 mL via INTRATHECAL

## 2019-03-19 MED ORDER — DIAZEPAM 5 MG PO TABS
10.0000 mg | ORAL_TABLET | Freq: Once | ORAL | Status: AC
  Administered 2019-03-19: 10 mg via ORAL

## 2019-03-19 NOTE — Progress Notes (Signed)
Pt reports he has been off of Fluoxetine and Ritalin for 48 hours and verbalizes understanding not to restart this medication until tomorrow 03/20/2019.

## 2019-03-19 NOTE — Discharge Instructions (Signed)
Myelogram Discharge Instructions  1. Go home and rest quietly for the next 24 hours.  It is important to lie flat for the next 24 hours.  Get up only to go to the restroom.  You may lie in the bed or on a couch on your back, your stomach, your left side or your right side.  You may have one pillow under your head.  You may have pillows between your knees while you are on your side or under your knees while you are on your back.  2. DO NOT drive today.  Recline the seat as far back as it will go, while still wearing your seat belt, on the way home.  3. You may get up to go to the bathroom as needed.  You may sit up for 10 minutes to eat.  You may resume your normal diet and medications unless otherwise indicated.  Drink lots of extra fluids today and tomorrow.  4. The incidence of headache, nausea, or vomiting is about 5% (one in 20 patients).  If you develop a headache, lie flat and drink plenty of fluids until the headache goes away.  Caffeinated beverages may be helpful.  If you develop severe nausea and vomiting or a headache that does not go away with flat bed rest, call [phone].  5. You may resume normal activities after your 24 hours of bed rest is over; however, do not exert yourself strongly or do any heavy lifting tomorrow. If when you get up you have a headache when standing, go back to bed and force fluids for another 24 hours.  6. Call your physician for a follow-up appointment.  The results of your myelogram will be sent directly to your physician by the following day.  7. If you have any questions or if complications develop after you arrive home, please call [phone].  Discharge instructions have been explained to the patient.  The patient, or the person responsible for the patient, fully understands these instructions.  YOU MAY RESTART YOUR FLUOXETINE AND RITALIN TOMORROW 03/20/2019 AT 10:30AM.

## 2019-04-02 DIAGNOSIS — M79604 Pain in right leg: Secondary | ICD-10-CM | POA: Diagnosis not present

## 2019-04-02 DIAGNOSIS — R200 Anesthesia of skin: Secondary | ICD-10-CM | POA: Diagnosis not present

## 2019-04-02 DIAGNOSIS — M5416 Radiculopathy, lumbar region: Secondary | ICD-10-CM | POA: Diagnosis not present

## 2019-04-08 ENCOUNTER — Ambulatory Visit: Payer: BLUE CROSS/BLUE SHIELD | Admitting: Specialist

## 2019-04-11 ENCOUNTER — Emergency Department (HOSPITAL_COMMUNITY)
Admission: EM | Admit: 2019-04-11 | Discharge: 2019-04-11 | Disposition: A | Discharge status: Home / Self Care | Payer: BC Managed Care – PPO | Attending: Emergency Medicine | Admitting: Emergency Medicine

## 2019-04-11 ENCOUNTER — Emergency Department (HOSPITAL_COMMUNITY): Payer: BC Managed Care – PPO

## 2019-04-11 ENCOUNTER — Other Ambulatory Visit: Payer: Self-pay

## 2019-04-11 ENCOUNTER — Encounter (HOSPITAL_COMMUNITY): Payer: Self-pay | Admitting: Emergency Medicine

## 2019-04-11 DIAGNOSIS — Z87891 Personal history of nicotine dependence: Secondary | ICD-10-CM | POA: Insufficient documentation

## 2019-04-11 DIAGNOSIS — J189 Pneumonia, unspecified organism: Secondary | ICD-10-CM | POA: Insufficient documentation

## 2019-04-11 DIAGNOSIS — R4182 Altered mental status, unspecified: Secondary | ICD-10-CM | POA: Diagnosis not present

## 2019-04-11 DIAGNOSIS — R918 Other nonspecific abnormal finding of lung field: Secondary | ICD-10-CM | POA: Diagnosis not present

## 2019-04-11 DIAGNOSIS — Z20828 Contact with and (suspected) exposure to other viral communicable diseases: Secondary | ICD-10-CM | POA: Diagnosis not present

## 2019-04-11 DIAGNOSIS — R7401 Elevation of levels of liver transaminase levels: Secondary | ICD-10-CM | POA: Diagnosis not present

## 2019-04-11 DIAGNOSIS — R9431 Abnormal electrocardiogram [ECG] [EKG]: Secondary | ICD-10-CM | POA: Diagnosis not present

## 2019-04-11 DIAGNOSIS — G9340 Encephalopathy, unspecified: Secondary | ICD-10-CM | POA: Diagnosis not present

## 2019-04-11 DIAGNOSIS — Z79899 Other long term (current) drug therapy: Secondary | ICD-10-CM | POA: Diagnosis not present

## 2019-04-11 DIAGNOSIS — R5383 Other fatigue: Secondary | ICD-10-CM | POA: Diagnosis not present

## 2019-04-11 DIAGNOSIS — M545 Low back pain: Secondary | ICD-10-CM | POA: Insufficient documentation

## 2019-04-11 HISTORY — DX: Dorsalgia, unspecified: M54.9

## 2019-04-11 LAB — CBC
HCT: 47.7 % (ref 39.0–52.0)
Hemoglobin: 16 g/dL (ref 13.0–17.0)
MCH: 30.7 pg (ref 26.0–34.0)
MCHC: 33.5 g/dL (ref 30.0–36.0)
MCV: 91.4 fL (ref 80.0–100.0)
Platelets: 241 10*3/uL (ref 150–400)
RBC: 5.22 MIL/uL (ref 4.22–5.81)
RDW: 13.1 % (ref 11.5–15.5)
WBC: 11.5 10*3/uL — ABNORMAL HIGH (ref 4.0–10.5)
nRBC: 0 % (ref 0.0–0.2)

## 2019-04-11 LAB — ETHANOL: Alcohol, Ethyl (B): <10 mg/dL (ref <10)

## 2019-04-11 LAB — COMPREHENSIVE METABOLIC PANEL
ALT: 430 U/L — ABNORMAL HIGH (ref 0–44)
AST: 560 U/L — ABNORMAL HIGH (ref 15–41)
Albumin: 3.8 g/dL (ref 3.5–5.0)
Alkaline Phosphatase: 106 U/L (ref 38–126)
Anion gap: 8 (ref 5–15)
BUN: 9 mg/dL (ref 6–20)
CO2: 25 mmol/L (ref 22–32)
Calcium: 9.2 mg/dL (ref 8.9–10.3)
Chloride: 105 mmol/L (ref 98–111)
Creatinine, Ser: 0.84 mg/dL (ref 0.61–1.24)
GFR calc Af Amer: >60 mL/min (ref >60)
GFR calc non Af Amer: >60 mL/min (ref >60)
Glucose, Bld: 89 mg/dL (ref 70–99)
Potassium: 4.4 mmol/L (ref 3.5–5.1)
Sodium: 138 mmol/L (ref 135–145)
Total Bilirubin: 1 mg/dL (ref 0.3–1.2)
Total Protein: 6.6 g/dL (ref 6.5–8.1)

## 2019-04-11 LAB — AMMONIA: Ammonia: 34 umol/L (ref 9–35)

## 2019-04-11 LAB — SARS CORONAVIRUS 2 BY RT PCR (HOSPITAL ORDER, PERFORMED IN ~~LOC~~ HOSPITAL LAB): SARS Coronavirus 2: NEGATIVE

## 2019-04-11 LAB — TSH: TSH: 0.513 u[IU]/mL (ref 0.350–4.500)

## 2019-04-11 LAB — ACETAMINOPHEN LEVEL: Acetaminophen (Tylenol), Serum: <10 ug/mL — ABNORMAL LOW (ref 10–30)

## 2019-04-11 LAB — CK: Total CK: 915 U/L — ABNORMAL HIGH (ref 49–397)

## 2019-04-11 LAB — SALICYLATE LEVEL: Salicylate Lvl: <7 mg/dL (ref 2.8–30.0)

## 2019-04-11 MED ORDER — SODIUM CHLORIDE 0.9 % IV BOLUS
1000.0000 mL | Freq: Once | INTRAVENOUS | Status: AC
  Administered 2019-04-11: 14:00:00 1000 mL via INTRAVENOUS

## 2019-04-11 MED ORDER — DOXYCYCLINE HYCLATE 100 MG PO CAPS: 100.0000 mg | ORAL_CAPSULE | Freq: Two times a day (BID) | ORAL | 0 refills | Status: AC

## 2019-04-11 MED ORDER — DOXYCYCLINE HYCLATE 100 MG PO TABS
100.0000 mg | ORAL_TABLET | Freq: Once | ORAL | Status: AC
  Administered 2019-04-11: 100 mg via ORAL
  Filled 2019-04-11: qty 1

## 2019-04-11 MED ORDER — SODIUM CHLORIDE 0.9% FLUSH: 3.0000 mL | Freq: Once | INTRAVENOUS | Status: DC

## 2019-04-11 NOTE — Discharge Instructions (Signed)
Please call your primary doctor to schedule an appointment for recheck on Monday.  If at any time your symptoms worsen either with lethargy, or difficulty breathing, please return immediately to the emergency department for reassessment.  Please take the antibiotic as prescribed.  Recommend Tylenol, Motrin as needed for fevers.  Additionally discussed the elevation in your liver enzymes with your primary doctor.  These will likely need to be rechecked at some point.

## 2019-04-11 NOTE — ED Notes (Signed)
Family at bedside. 

## 2019-04-11 NOTE — ED Notes (Signed)
ED Provider at bedside. 

## 2019-04-11 NOTE — ED Provider Notes (Signed)
[facility] EMERGENCY DEPARTMENT Provider Note   CSN: [id number] Arrival date & time: 04/11/19  0902     History   Chief Complaint Chief Complaint  Patient presents with   Altered Mental Status    HPI Robert Hess is a 38 y.o. male.  Presents emerge department chief complaint of lethargy.  Patient states when he woke up this morning he felt extremely fatigued, lethargic.  States he went to bed last night as per normal.  Has had some increased stress and took his wife's Xanax, 1 tablet of 0.25 mg at 11 PM along with his nightly gabapentin.  States he has chronic low back pain that radiates down his right hip, no change.  Denies taking any additional substances.  States he has prior history of alcohol abuse, has been 11 months sober.  No SI, HI, AVH.  No chest pain, difficulty breathing, abdominal pain, nausea, vomiting.  No neck pain or neck stiffness.  No fever.  Wife states he was so tired he had a hard time getting up to walk this morning.  No focal numbness, weakness, vision change, speech change.     HPI  Past Medical History:  Diagnosis Date   Back pain     There are no active problems to display for this patient.   Past Surgical History:  Procedure Laterality Date   HERNIA REPAIR     LAPAROSCOPIC GASTRIC BANDING          Home Medications    Prior to Admission medications   Medication Sig Start Date End Date Taking? Authorizing Provider  acetaminophen (TYLENOL) 500 MG tablet Take 1,000 mg by mouth every 6 (six) hours as needed for mild pain, moderate pain, fever or headache.    [provider]  ALPRAZolam Prudy Feeler) 0.5 MG tablet Take 2 tablets approximately 45 minutes prior to the MRI study, take a third tablet if needed. 11/26/18   York Spaniel, MD  FLUoxetine (PROZAC) 40 MG capsule Take 80 mg by mouth daily.    [provider]  gabapentin (NEURONTIN) 300 MG capsule Take 300 mg by mouth 3 (three) times daily.    [provider]  lansoprazole (PREVACID) 30 MG capsule Take 30 mg by mouth daily.    [provider]  methylphenidate (RITALIN LA) 20 MG 24 hr capsule Take 20 mg by mouth every morning.    [provider]    Family History No family history on file.  Social History Social History   Tobacco Use   Smoking status: Former Smoker   Smokeless tobacco: Never Used  Substance Use Topics   Alcohol use: Yes    Comment: occ   Drug use: No     Allergies   Other and Oxycodone   Review of Systems Review of Systems  Constitutional: Positive for fatigue. Negative for chills and fever.  HENT: Negative for ear pain and sore throat.   Eyes: Negative for pain and visual disturbance.  Respiratory: Negative for cough and shortness of breath.   Cardiovascular: Negative for chest pain and palpitations.  Gastrointestinal: Negative for abdominal pain and vomiting.  Genitourinary: Negative for dysuria and hematuria.  Musculoskeletal: Negative for arthralgias and back pain.  Skin: Negative for color change and rash.  Neurological: Negative for seizures and syncope.  All other systems reviewed and are negative.    Physical Exam Updated Vital Signs BP 105/64 (BP Location: Left Arm)    Pulse 93    Resp 15  Physical Exam Vitals signs and nursing note reviewed.  Constitutional:      Appearance: He is well-developed.  HENT:     Head: Normocephalic and atraumatic.  Eyes:     Conjunctiva/sclera: Conjunctivae normal.  Neck:     Musculoskeletal: Neck supple.  Cardiovascular:     Rate and Rhythm: Normal rate and regular rhythm.     Heart sounds: No murmur.  Pulmonary:     Effort: Pulmonary effort is normal. No respiratory distress.     Breath sounds: Normal breath sounds.  Abdominal:     Palpations: Abdomen is soft.     Tenderness: There is no abdominal tenderness.  Musculoskeletal:        General: No swelling or tenderness.  Skin:    General: Skin is warm and dry.      Capillary Refill: Capillary refill takes less than 2 seconds.  Neurological:     Mental Status: He is alert.     Comments: Patient appears extremely drowsy but alert, oriented to person place and time, cranial nerves II through XII are intact, normal finger-nose-finger, 5 out of 5 strength in bilateral upper and lower extremities, sensation intact in all 4 extremities  Psychiatric:        Mood and Affect: Mood normal.        Behavior: Behavior normal.      ED Treatments / Results  Labs (all labs ordered are listed, but only abnormal results are displayed) Labs Reviewed  CBC - Abnormal; Notable for the following components:      Result Value   WBC 11.5 (*)    All other components within normal limits  COMPREHENSIVE METABOLIC PANEL  CBG MONITORING, ED    EKG EKG Interpretation  Date/Time:  Saturday April 11 2019 09:17:54 EDT Ventricular Rate:  100 PR Interval:  150 QRS Duration: 94 QT Interval:  334 QTC Calculation: 430 R Axis:   76 Text Interpretation:  Normal sinus rhythm Nonspecific T wave abnormality Abnormal ECG No acute STEMI No prior for comparison Confirmed by Madalyn Rob [phone]) on 04/11/2019 9:35:02 AM   Radiology No results found.  Procedures Procedures (including critical care time)  Medications Ordered in ED Medications  sodium chloride flush (NS) 0.9 % injection 3 mL (has no administration in time range)     Initial Impression / Assessment and Plan / ED Course  I have reviewed the triage vital signs and the nursing notes.  Pertinent labs & imaging results that were available during my care of the patient were reviewed by me and considered in my medical decision making (see chart for details).  Clinical Course as of Apr 11 1523  Sat Apr 11, 2019  1015 Completed initial assessment   [RD]  1130 Review chest x-ray, multifocal pneumonia, will check rapid COVID  DG Chest Portable 1 View [RD]  1130 CT head negative  CT Head Wo Contrast [RD]   1130 Mild CK elevation  CK Total(!): 915 [RD]  1308 Updated patient, ambulated in room without assistance, post ambulation oxygen 96%, no significant tachypnea, will give fluids, initial dose of abx and likely dc   [RD]    Clinical Course User Index [RD] Lucrezia Starch, MD       38 year old male presented with lethargy.  On exam patient was noted to be lethargic but had no focal neurologic deficits.  Completed broad work-up which included lab testing, CT head, chest x-ray.  He appears to have multifocal pneumonia, worse on the left side.  COVID was negative.  Concern for community-acquired pneumonia.  Patient also noted to have mild elevation in his CK and mild elevation in his LFTs.  Has no gallbladder, no focal tenderness in his abdomen, doubt acute abdominal pathology, more likely related to dehydration.  Similarly with CK, doubt significant rhabdomyolysis, likely mild dehydration.  Patient after fluids and time was significantly improved, he ambulated in room without difficulty, no hypoxia.  At this time I believe he is appropriate for outpatient management, reviewed return precautions extensively with patient and wife who is able to monitor him closely at home.  Recommended recheck on Monday with PCP, otherwise to return for any worsening symptoms.  Will treat with a seven-day course of doxycycline for CAP.    After the discussed management above, the patient was determined to be safe for discharge.  The patient was in agreement with this plan and all questions regarding their care were answered.  ED return precautions were discussed and the patient will return to the ED with any significant worsening of condition.    Final Clinical Impressions(s) / ED Diagnoses   Final diagnoses:  Community acquired pneumonia, unspecified laterality  Transaminitis    ED Discharge Orders    None       Milagros Lollykstra, Jamya Starry S, MD 04/11/19 1527

## 2019-04-11 NOTE — ED Notes (Signed)
Pt verbalized understanding of d/c instructions and has no further questions, VSS, NAD.  

## 2019-04-11 NOTE — ED Triage Notes (Signed)
Pt/wife stated, He has not been able to wake up completely, is slow to answer. Pt took his wife's Xanax around 83 with his Gabapentin. He is alert when aroused. Answered date, name , DOB,, Neg VAN . Wife stated, he said this morning his hiops were really hurting.

## 2019-04-11 NOTE — ED Notes (Signed)
Pt taken to CT.

## 2019-04-14 ENCOUNTER — Telehealth: Payer: Self-pay | Admitting: Physician Assistant

## 2019-04-14 DIAGNOSIS — R112 Nausea with vomiting, unspecified: Secondary | ICD-10-CM

## 2019-04-14 DIAGNOSIS — R059 Cough, unspecified: Secondary | ICD-10-CM

## 2019-04-14 DIAGNOSIS — R05 Cough: Secondary | ICD-10-CM

## 2019-04-14 NOTE — Progress Notes (Signed)
Based on what you shared with me, I feel your condition warrants further evaluation and I recommend that you be seen for a face to face office visit.  In your message you describe many symptoms that are concerning including a weight loss of almost 8 pounds in only 4 days, persistent vomiting, and shortness of breath.  I am concerned that you are having persistent shortness of breath despite being on appropriate antibiotics for your pneumonia. These symptoms are difficult to evaluate via an e-visit and may be a sign of something more serious going on. I think that you will need an in person evaluation so that we can assess your vitals signs. You may need to have repeat laboratory work and potentially a repeat chest xray to further assess your symptoms. Please seek a face to face evaluation today at either urgent care or the emergency department.   NOTE: If you entered your credit card information for this eVisit, you will not be charged. You may see a "hold" on your card for the $35 but that hold will drop off and you will not have a charge processed.  If you are having a true medical emergency please call 911.     For an urgent face to face visit, [facility] has four urgent care centers for your convenience:   . [facility]    [phone]                  Get Driving Directions  [address], [place] [address] . 10 am to 8 pm Monday-Friday . 12 pm to 8 pm Saturday-Sunday   . [facility] at [place]                  Get Driving Directions  [address], [place], [place] [address] . 8 am to 8 pm Monday-Friday . 9 am to 6 pm Saturday . 11 am to 6 pm Sunday   . [facility] at [place]                  Get Driving Directions   [address], [place] [address] . 8 am to 8 pm Monday-Friday . 8 am to 4 pm Saturday-Sunday    . [facility]  at [place]                    Get Driving Directions  [phone]  [address], [place], [place] [address]  . Monday-Friday, 12 PM to 6 PM  You may also go to your nearest emergency department for evaluation.   Your e-visit answers were reviewed by a board certified advanced clinical practitioner to complete your personal care plan.  Thank you for using e-Visits.  Greater than 5 minutes, yet less than 10 minutes of time have been spend researching, coordinating, and implementing care for this patient today.

## 2019-04-30 DIAGNOSIS — M79604 Pain in right leg: Secondary | ICD-10-CM | POA: Diagnosis not present

## 2019-06-24 DIAGNOSIS — M461 Sacroiliitis, not elsewhere classified: Secondary | ICD-10-CM | POA: Diagnosis not present

## 2020-09-12 ENCOUNTER — Other Ambulatory Visit: Payer: Self-pay | Admitting: Student

## 2020-09-12 DIAGNOSIS — M79604 Pain in right leg: Secondary | ICD-10-CM

## 2020-09-14 ENCOUNTER — Other Ambulatory Visit: Payer: Self-pay

## 2020-09-14 ENCOUNTER — Ambulatory Visit
Admission: RE | Admit: 2020-09-14 | Discharge: 2020-09-14 | Disposition: A | Discharge status: Home / Self Care | Payer: BC Managed Care – PPO | Source: Ambulatory Visit | Attending: Student | Admitting: Student

## 2020-09-14 DIAGNOSIS — M79604 Pain in right leg: Secondary | ICD-10-CM

## 2020-10-02 ENCOUNTER — Emergency Department (HOSPITAL_COMMUNITY): Payer: 59

## 2020-10-02 ENCOUNTER — Other Ambulatory Visit: Payer: Self-pay

## 2020-10-02 ENCOUNTER — Encounter (HOSPITAL_COMMUNITY): Payer: Self-pay

## 2020-10-02 ENCOUNTER — Emergency Department (HOSPITAL_COMMUNITY)
Admission: EM | Admit: 2020-10-02 | Discharge: 2020-10-03 | Disposition: A | Discharge status: Home / Self Care | Payer: 59 | Attending: Emergency Medicine | Admitting: Emergency Medicine

## 2020-10-02 DIAGNOSIS — M545 Low back pain, unspecified: Secondary | ICD-10-CM | POA: Diagnosis not present

## 2020-10-02 DIAGNOSIS — Z87891 Personal history of nicotine dependence: Secondary | ICD-10-CM | POA: Insufficient documentation

## 2020-10-02 DIAGNOSIS — G8929 Other chronic pain: Secondary | ICD-10-CM | POA: Insufficient documentation

## 2020-10-02 NOTE — ED Triage Notes (Signed)
Pt reports that he has a ruptured disc in his lower back. About 4:30pm started having severe pain. States that he was not doing anything when it occurred. Numbness to right foot. Pain to left foot. Denies loss of bowel or bladder. Strong pedal pulse present. Cap refill < 3 sec.

## 2020-10-03 MED ORDER — METHOCARBAMOL 1000 MG/10ML IJ SOLN
1000.0000 mg | Freq: Once | INTRAVENOUS | Status: AC
  Administered 2020-10-03: 1000 mg via INTRAVENOUS
  Filled 2020-10-03: qty 10

## 2020-10-03 MED ORDER — IBUPROFEN 400 MG PO TABS
400.0000 mg | ORAL_TABLET | Freq: Once | ORAL | Status: DC | PRN
  Filled 2020-10-03: qty 1

## 2020-10-03 MED ORDER — ACETAMINOPHEN 325 MG PO TABS
650.0000 mg | ORAL_TABLET | Freq: Once | ORAL | Status: AC
  Administered 2020-10-03: 650 mg via ORAL
  Filled 2020-10-03: qty 2

## 2020-10-03 MED ORDER — KETOROLAC TROMETHAMINE 30 MG/ML IJ SOLN
30.0000 mg | Freq: Once | INTRAMUSCULAR | Status: AC
  Administered 2020-10-03: 30 mg via INTRAVENOUS
  Filled 2020-10-03: qty 1

## 2020-10-03 MED ORDER — CYCLOBENZAPRINE HCL 10 MG PO TABS: 10.0000 mg | ORAL_TABLET | Freq: Three times a day (TID) | ORAL | 0 refills | Status: DC | PRN

## 2020-10-03 MED ORDER — TRAMADOL HCL 50 MG PO TABS: 50.0000 mg | ORAL_TABLET | Freq: Four times a day (QID) | ORAL | 0 refills | Status: DC | PRN

## 2020-10-03 NOTE — ED Notes (Signed)
Pt ambulated to restroom without assistance at this time ?

## 2020-10-03 NOTE — ED Notes (Signed)
Verbal report given to melissa rn at this time 

## 2020-10-03 NOTE — ED Notes (Signed)
Pt just arrived to bed

## 2020-10-03 NOTE — Discharge Instructions (Addendum)
Apply ice for 30 minutes at a time, 4 times a day.  Take acetaminophen as needed for pain, reserve tramadol for more severe pain.
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
## 2020-10-13 ENCOUNTER — Other Ambulatory Visit: Payer: Self-pay | Admitting: Physical Medicine and Rehabilitation

## 2020-10-13 DIAGNOSIS — M5416 Radiculopathy, lumbar region: Secondary | ICD-10-CM

## 2020-11-09 ENCOUNTER — Ambulatory Visit
Admission: RE | Admit: 2020-11-09 | Discharge: 2020-11-09 | Disposition: A | Discharge status: Home / Self Care | Payer: 59 | Source: Ambulatory Visit | Attending: Physical Medicine and Rehabilitation | Admitting: Physical Medicine and Rehabilitation

## 2020-11-09 DIAGNOSIS — M5416 Radiculopathy, lumbar region: Secondary | ICD-10-CM

## 2020-12-12 ENCOUNTER — Other Ambulatory Visit: Payer: Self-pay | Admitting: Physical Medicine and Rehabilitation

## 2020-12-12 DIAGNOSIS — M5416 Radiculopathy, lumbar region: Secondary | ICD-10-CM

## 2020-12-28 ENCOUNTER — Ambulatory Visit
Admission: RE | Admit: 2020-12-28 | Discharge: 2020-12-28 | Disposition: A | Discharge status: Home / Self Care | Payer: 59 | Source: Ambulatory Visit | Attending: Physical Medicine and Rehabilitation | Admitting: Physical Medicine and Rehabilitation

## 2020-12-28 ENCOUNTER — Other Ambulatory Visit: Payer: Self-pay

## 2020-12-28 DIAGNOSIS — Z9884 Bariatric surgery status: Secondary | ICD-10-CM

## 2020-12-28 DIAGNOSIS — K562 Volvulus: Principal | ICD-10-CM | POA: Diagnosis present

## 2020-12-28 DIAGNOSIS — Z79899 Other long term (current) drug therapy: Secondary | ICD-10-CM

## 2020-12-28 DIAGNOSIS — M5416 Radiculopathy, lumbar region: Secondary | ICD-10-CM

## 2020-12-28 DIAGNOSIS — Z885 Allergy status to narcotic agent status: Secondary | ICD-10-CM

## 2020-12-28 DIAGNOSIS — M549 Dorsalgia, unspecified: Secondary | ICD-10-CM | POA: Diagnosis present

## 2020-12-28 DIAGNOSIS — Z9109 Other allergy status, other than to drugs and biological substances: Secondary | ICD-10-CM

## 2020-12-28 DIAGNOSIS — Z87891 Personal history of nicotine dependence: Secondary | ICD-10-CM

## 2020-12-28 DIAGNOSIS — Z9049 Acquired absence of other specified parts of digestive tract: Secondary | ICD-10-CM

## 2020-12-28 DIAGNOSIS — Z886 Allergy status to analgesic agent status: Secondary | ICD-10-CM

## 2020-12-28 DIAGNOSIS — Z20822 Contact with and (suspected) exposure to covid-19: Secondary | ICD-10-CM | POA: Diagnosis present

## 2020-12-29 ENCOUNTER — Emergency Department (HOSPITAL_COMMUNITY): Payer: 59 | Admitting: Anesthesiology

## 2020-12-29 ENCOUNTER — Encounter (HOSPITAL_BASED_OUTPATIENT_CLINIC_OR_DEPARTMENT_OTHER): Payer: Self-pay

## 2020-12-29 ENCOUNTER — Encounter (HOSPITAL_COMMUNITY): Admission: EM | Disposition: A | Discharge status: Home / Self Care | Payer: Self-pay | Source: Home / Self Care

## 2020-12-29 ENCOUNTER — Emergency Department (HOSPITAL_BASED_OUTPATIENT_CLINIC_OR_DEPARTMENT_OTHER): Payer: 59

## 2020-12-29 ENCOUNTER — Other Ambulatory Visit: Payer: Self-pay

## 2020-12-29 ENCOUNTER — Inpatient Hospital Stay (HOSPITAL_BASED_OUTPATIENT_CLINIC_OR_DEPARTMENT_OTHER)
Admission: EM | Admit: 2020-12-29 | Discharge: 2020-12-30 | DRG: 331 | Disposition: A | Discharge status: Home / Self Care | Payer: 59 | Attending: Surgery | Admitting: Surgery

## 2020-12-29 DIAGNOSIS — M549 Dorsalgia, unspecified: Secondary | ICD-10-CM | POA: Diagnosis present

## 2020-12-29 DIAGNOSIS — Z79899 Other long term (current) drug therapy: Secondary | ICD-10-CM | POA: Diagnosis not present

## 2020-12-29 DIAGNOSIS — K562 Volvulus: Secondary | ICD-10-CM | POA: Diagnosis present

## 2020-12-29 DIAGNOSIS — Z9049 Acquired absence of other specified parts of digestive tract: Secondary | ICD-10-CM | POA: Diagnosis not present

## 2020-12-29 DIAGNOSIS — Z9884 Bariatric surgery status: Secondary | ICD-10-CM | POA: Diagnosis not present

## 2020-12-29 DIAGNOSIS — Z20822 Contact with and (suspected) exposure to covid-19: Secondary | ICD-10-CM | POA: Diagnosis present

## 2020-12-29 DIAGNOSIS — Z87891 Personal history of nicotine dependence: Secondary | ICD-10-CM | POA: Diagnosis not present

## 2020-12-29 DIAGNOSIS — Z885 Allergy status to narcotic agent status: Secondary | ICD-10-CM | POA: Diagnosis not present

## 2020-12-29 DIAGNOSIS — Z9109 Other allergy status, other than to drugs and biological substances: Secondary | ICD-10-CM | POA: Diagnosis not present

## 2020-12-29 DIAGNOSIS — Z886 Allergy status to analgesic agent status: Secondary | ICD-10-CM | POA: Diagnosis not present

## 2020-12-29 HISTORY — PX: LAPAROSCOPIC REMOVAL OF MESENTERIC MASS: SHX5917

## 2020-12-29 LAB — RESP PANEL BY RT-PCR (FLU A&B, COVID) ARPGX2
Influenza A by PCR: NEGATIVE
Influenza B by PCR: NEGATIVE
SARS Coronavirus 2 by RT PCR: NEGATIVE

## 2020-12-29 LAB — BASIC METABOLIC PANEL
Anion gap: 9 (ref 5–15)
BUN: 8 mg/dL (ref 6–20)
CO2: 25 mmol/L (ref 22–32)
Calcium: 9.2 mg/dL (ref 8.9–10.3)
Chloride: 102 mmol/L (ref 98–111)
Creatinine, Ser: 0.79 mg/dL (ref 0.61–1.24)
GFR, Estimated: >60 mL/min (ref >60)
Glucose, Bld: 115 mg/dL — ABNORMAL HIGH (ref 70–99)
Potassium: 4 mmol/L (ref 3.5–5.1)
Sodium: 136 mmol/L (ref 135–145)

## 2020-12-29 LAB — CBC WITH DIFFERENTIAL/PLATELET
Abs Immature Granulocytes: 0.02 10*3/uL (ref 0.00–0.07)
Basophils Absolute: 0 10*3/uL (ref 0.0–0.1)
Basophils Relative: 0 %
Eosinophils Absolute: 0 10*3/uL (ref 0.0–0.5)
Eosinophils Relative: 0 %
HCT: 45 % (ref 39.0–52.0)
Hemoglobin: 14.9 g/dL (ref 13.0–17.0)
Immature Granulocytes: 0 %
Lymphocytes Relative: 22 %
Lymphs Abs: 2.2 10*3/uL (ref 0.7–4.0)
MCH: 29 pg (ref 26.0–34.0)
MCHC: 33.1 g/dL (ref 30.0–36.0)
MCV: 87.7 fL (ref 80.0–100.0)
Monocytes Absolute: 0.6 10*3/uL (ref 0.1–1.0)
Monocytes Relative: 6 %
Neutro Abs: 7 10*3/uL (ref 1.7–7.7)
Neutrophils Relative %: 72 %
Platelets: 253 10*3/uL (ref 150–400)
RBC: 5.13 MIL/uL (ref 4.22–5.81)
RDW: 13.1 % (ref 11.5–15.5)
WBC: 9.9 10*3/uL (ref 4.0–10.5)
nRBC: 0 % (ref 0.0–0.2)

## 2020-12-29 LAB — LACTIC ACID, PLASMA
Lactic Acid, Venous: 1.8 mmol/L (ref 0.5–1.9)
Lactic Acid, Venous: 2.3 mmol/L (ref 0.5–1.9)

## 2020-12-29 LAB — CBC
HCT: 43 % (ref 39.0–52.0)
Hemoglobin: 14.3 g/dL (ref 13.0–17.0)
MCH: 29.4 pg (ref 26.0–34.0)
MCHC: 33.3 g/dL (ref 30.0–36.0)
MCV: 88.5 fL (ref 80.0–100.0)
Platelets: 219 10*3/uL (ref 150–400)
RBC: 4.86 MIL/uL (ref 4.22–5.81)
RDW: 13 % (ref 11.5–15.5)
WBC: 10.1 10*3/uL (ref 4.0–10.5)
nRBC: 0 % (ref 0.0–0.2)

## 2020-12-29 LAB — COMPREHENSIVE METABOLIC PANEL
ALT: 12 U/L (ref 0–44)
AST: 15 U/L (ref 15–41)
Albumin: 4.4 g/dL (ref 3.5–5.0)
Alkaline Phosphatase: 78 U/L (ref 38–126)
Anion gap: 9 (ref 5–15)
BUN: 14 mg/dL (ref 6–20)
CO2: 25 mmol/L (ref 22–32)
Calcium: 10.3 mg/dL (ref 8.9–10.3)
Chloride: 102 mmol/L (ref 98–111)
Creatinine, Ser: 0.74 mg/dL (ref 0.61–1.24)
GFR, Estimated: >60 mL/min (ref >60)
Glucose, Bld: 122 mg/dL — ABNORMAL HIGH (ref 70–99)
Potassium: 4.2 mmol/L (ref 3.5–5.1)
Sodium: 136 mmol/L (ref 135–145)
Total Bilirubin: 0.5 mg/dL (ref 0.3–1.2)
Total Protein: 7.2 g/dL (ref 6.5–8.1)

## 2020-12-29 LAB — LIPASE, BLOOD: Lipase: 29 U/L (ref 11–51)

## 2020-12-29 LAB — MAGNESIUM: Magnesium: 1.7 mg/dL (ref 1.7–2.4)

## 2020-12-29 LAB — HIV ANTIBODY (ROUTINE TESTING W REFLEX): HIV Screen 4th Generation wRfx: NONREACTIVE

## 2020-12-29 SURGERY — EXCISION, MASS, MESENTERIC, LAPAROSCOPIC
Anesthesia: General | Site: Abdomen

## 2020-12-29 MED ORDER — BISACODYL 10 MG RE SUPP: 10.0000 mg | Freq: Every day | RECTAL | Status: DC | PRN

## 2020-12-29 MED ORDER — ONDANSETRON HCL 4 MG/2ML IJ SOLN
INTRAMUSCULAR | Status: DC | PRN
  Administered 2020-12-29: 4 mg via INTRAVENOUS

## 2020-12-29 MED ORDER — SODIUM CHLORIDE 0.9 % IV SOLN: INTRAVENOUS | Status: DC

## 2020-12-29 MED ORDER — DEXAMETHASONE SODIUM PHOSPHATE 10 MG/ML IJ SOLN
INTRAMUSCULAR | Status: DC | PRN
  Administered 2020-12-29: 10 mg via INTRAVENOUS

## 2020-12-29 MED ORDER — FENTANYL CITRATE (PF) 250 MCG/5ML IJ SOLN
INTRAMUSCULAR | Status: DC | PRN
  Administered 2020-12-29: 50 ug via INTRAVENOUS
  Administered 2020-12-29: 100 ug via INTRAVENOUS
  Administered 2020-12-29 (×2): 50 ug via INTRAVENOUS
  Administered 2020-12-29: 100 ug via INTRAVENOUS

## 2020-12-29 MED ORDER — ONDANSETRON 4 MG PO TBDP
4.0000 mg | ORAL_TABLET | Freq: Four times a day (QID) | ORAL | Status: DC | PRN
  Filled 2020-12-29: qty 1

## 2020-12-29 MED ORDER — ENOXAPARIN SODIUM 40 MG/0.4ML IJ SOSY
40.0000 mg | PREFILLED_SYRINGE | INTRAMUSCULAR | Status: DC
  Administered 2020-12-29: 40 mg via SUBCUTANEOUS
  Filled 2020-12-29: qty 0.4

## 2020-12-29 MED ORDER — ACETAMINOPHEN 500 MG PO TABS
1000.0000 mg | ORAL_TABLET | Freq: Four times a day (QID) | ORAL | Status: DC
  Administered 2020-12-29 – 2020-12-30 (×5): 1000 mg via ORAL
  Filled 2020-12-29 (×5): qty 2

## 2020-12-29 MED ORDER — MIDAZOLAM HCL 2 MG/2ML IJ SOLN
INTRAMUSCULAR | Status: AC
  Filled 2020-12-29: qty 2

## 2020-12-29 MED ORDER — DIPHENHYDRAMINE HCL 50 MG/ML IJ SOLN
INTRAMUSCULAR | Status: DC | PRN
  Administered 2020-12-29: 12.5 mg via INTRAVENOUS

## 2020-12-29 MED ORDER — BUPIVACAINE-EPINEPHRINE 0.25% -1:200000 IJ SOLN
INTRAMUSCULAR | Status: DC | PRN
  Administered 2020-12-29: 30 mL

## 2020-12-29 MED ORDER — BUPIVACAINE-EPINEPHRINE (PF) 0.25% -1:200000 IJ SOLN
INTRAMUSCULAR | Status: AC
  Filled 2020-12-29: qty 30

## 2020-12-29 MED ORDER — CEFAZOLIN SODIUM 1 G IJ SOLR
INTRAMUSCULAR | Status: AC
  Filled 2020-12-29: qty 20

## 2020-12-29 MED ORDER — DEXAMETHASONE SODIUM PHOSPHATE 10 MG/ML IJ SOLN
INTRAMUSCULAR | Status: AC
  Filled 2020-12-29: qty 1

## 2020-12-29 MED ORDER — ROCURONIUM BROMIDE 10 MG/ML (PF) SYRINGE
PREFILLED_SYRINGE | INTRAVENOUS | Status: AC
  Filled 2020-12-29: qty 10

## 2020-12-29 MED ORDER — METOPROLOL TARTRATE 5 MG/5ML IV SOLN: 5.0000 mg | Freq: Four times a day (QID) | INTRAVENOUS | Status: DC | PRN

## 2020-12-29 MED ORDER — DIPHENHYDRAMINE HCL 25 MG PO CAPS: 25.0000 mg | ORAL_CAPSULE | Freq: Four times a day (QID) | ORAL | Status: DC | PRN

## 2020-12-29 MED ORDER — DOCUSATE SODIUM 100 MG PO CAPS
100.0000 mg | ORAL_CAPSULE | Freq: Two times a day (BID) | ORAL | Status: DC
  Administered 2020-12-29 – 2020-12-30 (×3): 100 mg via ORAL
  Filled 2020-12-29 (×3): qty 1

## 2020-12-29 MED ORDER — ONDANSETRON HCL 4 MG/2ML IJ SOLN
4.0000 mg | Freq: Once | INTRAMUSCULAR | Status: AC
  Administered 2020-12-29: 4 mg via INTRAVENOUS
  Filled 2020-12-29: qty 2

## 2020-12-29 MED ORDER — FENTANYL CITRATE (PF) 250 MCG/5ML IJ SOLN
INTRAMUSCULAR | Status: AC
  Filled 2020-12-29: qty 5

## 2020-12-29 MED ORDER — FENTANYL CITRATE (PF) 100 MCG/2ML IJ SOLN: 25.0000 ug | INTRAMUSCULAR | Status: DC | PRN

## 2020-12-29 MED ORDER — METHOCARBAMOL 1000 MG/10ML IJ SOLN: 500.0000 mg | Freq: Four times a day (QID) | INTRAVENOUS | Status: DC | PRN

## 2020-12-29 MED ORDER — ONDANSETRON HCL 4 MG/2ML IJ SOLN: 4.0000 mg | Freq: Four times a day (QID) | INTRAMUSCULAR | Status: DC | PRN

## 2020-12-29 MED ORDER — HYDRALAZINE HCL 20 MG/ML IJ SOLN: 10.0000 mg | INTRAMUSCULAR | Status: DC | PRN

## 2020-12-29 MED ORDER — ONDANSETRON HCL 4 MG/2ML IJ SOLN: 4.0000 mg | Freq: Once | INTRAMUSCULAR | Status: DC | PRN

## 2020-12-29 MED ORDER — HYDROMORPHONE HCL 1 MG/ML IJ SOLN
1.0000 mg | Freq: Once | INTRAMUSCULAR | Status: AC
  Administered 2020-12-29: 1 mg via INTRAVENOUS
  Filled 2020-12-29: qty 1

## 2020-12-29 MED ORDER — LACTATED RINGERS IV SOLN: INTRAVENOUS | Status: DC | PRN

## 2020-12-29 MED ORDER — LIDOCAINE 2% (20 MG/ML) 5 ML SYRINGE
INTRAMUSCULAR | Status: DC | PRN
  Administered 2020-12-29: 60 mg via INTRAVENOUS

## 2020-12-29 MED ORDER — HYDROMORPHONE HCL 1 MG/ML IJ SOLN: 0.5000 mg | INTRAMUSCULAR | Status: DC | PRN

## 2020-12-29 MED ORDER — CEFAZOLIN SODIUM-DEXTROSE 2-3 GM-%(50ML) IV SOLR
INTRAVENOUS | Status: DC | PRN
  Administered 2020-12-29: 2 g via INTRAVENOUS

## 2020-12-29 MED ORDER — TRAMADOL HCL 50 MG PO TABS: 50.0000 mg | ORAL_TABLET | Freq: Four times a day (QID) | ORAL | Status: DC | PRN

## 2020-12-29 MED ORDER — PROPOFOL 10 MG/ML IV BOLUS
INTRAVENOUS | Status: AC
  Filled 2020-12-29: qty 20

## 2020-12-29 MED ORDER — IOHEXOL 300 MG/ML  SOLN
100.0000 mL | Freq: Once | INTRAMUSCULAR | Status: AC | PRN
  Administered 2020-12-29: 100 mL via INTRAVENOUS

## 2020-12-29 MED ORDER — SODIUM CHLORIDE 0.9 % IR SOLN
Status: DC | PRN
  Administered 2020-12-29: 1

## 2020-12-29 MED ORDER — FENTANYL CITRATE (PF) 100 MCG/2ML IJ SOLN
100.0000 ug | Freq: Once | INTRAMUSCULAR | Status: AC
  Administered 2020-12-29: 100 ug via INTRAVENOUS
  Filled 2020-12-29: qty 2

## 2020-12-29 MED ORDER — OXYCODONE HCL 5 MG PO TABS
5.0000 mg | ORAL_TABLET | Freq: Four times a day (QID) | ORAL | Status: DC | PRN
Start: 2020-12-29 — End: 2020-12-30

## 2020-12-29 MED ORDER — CEFAZOLIN SODIUM-DEXTROSE 1-4 GM/50ML-% IV SOLN: 1.0000 g | INTRAVENOUS | Status: DC

## 2020-12-29 MED ORDER — SODIUM CHLORIDE 0.9 % IV SOLN
1000.0000 mL | INTRAVENOUS | Status: DC
  Administered 2020-12-29: 1000 mL via INTRAVENOUS

## 2020-12-29 MED ORDER — STERILE WATER FOR IRRIGATION IR SOLN
Status: DC | PRN
  Administered 2020-12-29: 200 mL

## 2020-12-29 MED ORDER — SUCCINYLCHOLINE CHLORIDE 200 MG/10ML IV SOSY
PREFILLED_SYRINGE | INTRAVENOUS | Status: AC
  Filled 2020-12-29: qty 10

## 2020-12-29 MED ORDER — SUCCINYLCHOLINE CHLORIDE 200 MG/10ML IV SOSY
PREFILLED_SYRINGE | INTRAVENOUS | Status: DC | PRN
  Administered 2020-12-29: 120 mg via INTRAVENOUS

## 2020-12-29 MED ORDER — GABAPENTIN 300 MG PO CAPS
300.0000 mg | ORAL_CAPSULE | Freq: Three times a day (TID) | ORAL | Status: DC
  Administered 2020-12-29 – 2020-12-30 (×4): 300 mg via ORAL
  Filled 2020-12-29 (×4): qty 1

## 2020-12-29 MED ORDER — LIDOCAINE HCL 1 % IJ SOLN
INTRAMUSCULAR | Status: AC
  Filled 2020-12-29: qty 20

## 2020-12-29 MED ORDER — SODIUM CHLORIDE 0.9 % IV BOLUS (SEPSIS)
1000.0000 mL | Freq: Once | INTRAVENOUS | Status: AC
  Administered 2020-12-29: 1000 mL via INTRAVENOUS

## 2020-12-29 MED ORDER — MIDAZOLAM HCL 5 MG/5ML IJ SOLN
INTRAMUSCULAR | Status: DC | PRN
  Administered 2020-12-29: 2 mg via INTRAVENOUS

## 2020-12-29 MED ORDER — SUGAMMADEX SODIUM 500 MG/5ML IV SOLN
INTRAVENOUS | Status: DC | PRN
  Administered 2020-12-29: 300 mg via INTRAVENOUS

## 2020-12-29 MED ORDER — DIPHENHYDRAMINE HCL 50 MG/ML IJ SOLN: 25.0000 mg | Freq: Four times a day (QID) | INTRAMUSCULAR | Status: DC | PRN

## 2020-12-29 MED ORDER — SUGAMMADEX SODIUM 500 MG/5ML IV SOLN
INTRAVENOUS | Status: AC
  Filled 2020-12-29: qty 5

## 2020-12-29 MED ORDER — PANTOPRAZOLE SODIUM 40 MG IV SOLR
40.0000 mg | Freq: Every day | INTRAVENOUS | Status: DC
  Administered 2020-12-29: 40 mg via INTRAVENOUS
  Filled 2020-12-29: qty 40

## 2020-12-29 MED ORDER — SIMETHICONE 80 MG PO CHEW: 40.0000 mg | CHEWABLE_TABLET | Freq: Four times a day (QID) | ORAL | Status: DC | PRN

## 2020-12-29 MED ORDER — PROPOFOL 10 MG/ML IV BOLUS
INTRAVENOUS | Status: DC | PRN
  Administered 2020-12-29: 200 mg via INTRAVENOUS

## 2020-12-29 MED ORDER — HYDROMORPHONE HCL 1 MG/ML IJ SOLN: 1.0000 mg | INTRAMUSCULAR | Status: DC | PRN

## 2020-12-29 MED ORDER — ROCURONIUM BROMIDE 10 MG/ML (PF) SYRINGE
PREFILLED_SYRINGE | INTRAVENOUS | Status: DC | PRN
  Administered 2020-12-29: 10 mg via INTRAVENOUS
  Administered 2020-12-29: 50 mg via INTRAVENOUS

## 2020-12-29 SURGICAL SUPPLY — 37 items
ADH SKN CLS APL DERMABOND .7 (GAUZE/BANDAGES/DRESSINGS) ×1
APL PRP STRL LF DISP 70% ISPRP (MISCELLANEOUS)
BLADE CLIPPER SURG (BLADE) ×1 IMPLANT
CANISTER SUCT 3000ML PPV (MISCELLANEOUS) ×2 IMPLANT
CHLORAPREP W/TINT 26 (MISCELLANEOUS) ×1 IMPLANT
COVER MAYO STAND STRL (DRAPES) ×1 IMPLANT
COVER SURGICAL LIGHT HANDLE (MISCELLANEOUS) ×2 IMPLANT
COVER WAND RF STERILE (DRAPES) ×1 IMPLANT
DECANTER SPIKE VIAL GLASS SM (MISCELLANEOUS) ×2 IMPLANT
DERMABOND ADVANCED (GAUZE/BANDAGES/DRESSINGS) ×1
DERMABOND ADVANCED .7 DNX12 (GAUZE/BANDAGES/DRESSINGS) ×1 IMPLANT
DRAPE WARM FLUID 44X44 (DRAPES) IMPLANT
DURAPREP 26ML APPLICATOR (WOUND CARE) ×1 IMPLANT
ELECT REM PT RETURN 9FT ADLT (ELECTROSURGICAL) ×2
ELECTRODE REM PT RTRN 9FT ADLT (ELECTROSURGICAL) ×1 IMPLANT
GLOVE SRG 8 PF TXTR STRL LF DI (GLOVE) IMPLANT
GLOVE SURG ENC MOIS LTX SZ6 (GLOVE) ×2 IMPLANT
GLOVE SURG UNDER LTX SZ6.5 (GLOVE) ×2 IMPLANT
GLOVE SURG UNDER POLY LF SZ8 (GLOVE) ×2
GOWN STRL REUS W/ TWL LRG LVL3 (GOWN DISPOSABLE) ×3 IMPLANT
GOWN STRL REUS W/TWL LRG LVL3 (GOWN DISPOSABLE) ×2
KIT BASIN OR (CUSTOM PROCEDURE TRAY) ×2 IMPLANT
KIT TURNOVER KIT B (KITS) ×2 IMPLANT
NS IRRIG 1000ML POUR BTL (IV SOLUTION) ×2 IMPLANT
PAD ARMBOARD 7.5X6 YLW CONV (MISCELLANEOUS) ×4 IMPLANT
SCISSORS LAP 5X35 DISP (ENDOMECHANICALS) IMPLANT
SET IRRIG TUBING LAPAROSCOPIC (IRRIGATION / IRRIGATOR) ×1 IMPLANT
SET TUBE SMOKE EVAC HIGH FLOW (TUBING) ×2 IMPLANT
SLEEVE ENDOPATH XCEL 5M (ENDOMECHANICALS) ×3 IMPLANT
SUT MNCRL AB 4-0 PS2 18 (SUTURE) ×2 IMPLANT
TOWEL GREEN STERILE (TOWEL DISPOSABLE) ×1 IMPLANT
TOWEL GREEN STERILE FF (TOWEL DISPOSABLE) ×2 IMPLANT
TRAY LAPAROSCOPIC MC (CUSTOM PROCEDURE TRAY) ×2 IMPLANT
TROCAR XCEL BLUNT TIP 100MML (ENDOMECHANICALS) ×1 IMPLANT
TROCAR XCEL NON-BLD 11X100MML (ENDOMECHANICALS) IMPLANT
TROCAR XCEL NON-BLD 5MMX100MML (ENDOMECHANICALS) ×2 IMPLANT
WARMER LAPAROSCOPE (MISCELLANEOUS) ×1 IMPLANT

## 2020-12-29 NOTE — ED Provider Notes (Signed)
[facility] EMERGENCY DEPT Provider Note  CSN: [id number] Arrival date & time: 12/28/20 2326  Chief Complaint(s) Abdominal Pain  HPI Robert Hess is a 40 y.o. male with a past medical history listed below including gastric bypass and status post cholecystectomy presents to the emergency department with gradual onset right upper quadrant/abdominal pain and gradually worse.  Pain began approximately 8 hours ago.  After a while became intermittent and fluctuating in intensity.  Associated with nausea without emesis.  No diarrhea.  Patient reports that he has not had any bowel movement since he started.  Also reports that he is not passing gas.  He denies any fevers, chills, recent fevers or infections.  No known suspicious food intake.  He reports that he fasted today.  HPI  Past Medical History Past Medical History:  Diagnosis Date   Back pain    There are no problems to display for this patient.  Home Medication(s) Prior to Admission medications   Medication Sig Start Date End Date Taking? Authorizing Provider  acetaminophen (TYLENOL) 500 MG tablet Take 1,000 mg by mouth every 6 (six) hours as needed for mild pain, moderate pain, fever or headache.    [provider]  cyclobenzaprine (FLEXERIL) 10 MG tablet Take 1 tablet (10 mg total) by mouth 3 (three) times daily as needed for muscle spasms. 10/03/20   Dione Booze, MD  FLUoxetine HCl 60 MG TABS Take 60 mg by mouth daily.     [provider]  gabapentin (NEURONTIN) 300 MG capsule Take 300 mg by mouth 3 (three) times daily.    [provider]  lansoprazole (PREVACID) 30 MG capsule Take 30 mg by mouth daily.    [provider]  methylphenidate (RITALIN LA) 20 MG 24 hr capsule Take 20 mg by mouth every morning.    [provider]  Multiple Vitamins tablet Take 1 tablet by mouth daily.    [provider]  traMADol (ULTRAM) 50 MG tablet Take 1 tablet (50 mg total) by mouth  every 6 (six) hours as needed. 10/03/20   Dione Booze, MD                                                                                                                                    Past Surgical History Past Surgical History:  Procedure Laterality Date   GASTRIC BYPASS     HERNIA REPAIR     LAPAROSCOPIC GASTRIC BANDING     Family History History reviewed. No pertinent family history.  Social History Social History   Tobacco Use   Smoking status: Former    Pack years: 0.00   Smokeless tobacco: Never  Vaping Use   Vaping Use: Never used  Substance Use Topics   Alcohol use: Yes    Comment: occ   Drug use: No   Allergies Other, Nsaids, and Oxycodone  Review of Systems Review of Systems All other systems  are reviewed and are negative for acute change except as noted in the HPI  Physical Exam Vital Signs  I have reviewed the triage vital signs BP 136/77   Pulse 67   Temp 98.3 F (36.8 C) (Oral)   Resp (!) 23   Ht 6\' 3"  (1.905 m)   Wt 127 kg   SpO2 99%   BMI 35.00 kg/m   Physical Exam Vitals reviewed.  Constitutional:      General: He is not in acute distress.    Appearance: He is well-developed. He is not diaphoretic.  HENT:     Head: Normocephalic and atraumatic.     Right Ear: External ear normal.     Left Ear: External ear normal.     Nose: Nose normal.     Mouth/Throat:     Mouth: Mucous membranes are moist.  Eyes:     General: No scleral icterus.    Conjunctiva/sclera: Conjunctivae normal.  Neck:     Trachea: Phonation normal.  Cardiovascular:     Rate and Rhythm: Normal rate and regular rhythm.  Pulmonary:     Effort: Pulmonary effort is normal. No respiratory distress.     Breath sounds: No stridor.  Abdominal:     General: There is no distension.     Tenderness: There is abdominal tenderness in the right upper quadrant, epigastric area and periumbilical area. There is no guarding or rebound.  Musculoskeletal:        General: Normal  range of motion.     Cervical back: Normal range of motion.  Neurological:     Mental Status: He is alert and oriented to person, place, and time.  Psychiatric:        Behavior: Behavior normal.    ED Results and Treatments Labs (all labs ordered are listed, but only abnormal results are displayed) Labs Reviewed  COMPREHENSIVE METABOLIC PANEL - Abnormal; Notable for the following components:      Result Value   Glucose, Bld 122 (*)    All other components within normal limits  RESP PANEL BY RT-PCR (FLU A&B, COVID) ARPGX2  CBC WITH DIFFERENTIAL/PLATELET  LIPASE, BLOOD  LACTIC ACID, PLASMA  LACTIC ACID, PLASMA                                                                                                                         EKG  EKG Interpretation  Date/Time:  Thursday December 29 2020 00:13:27 EDT Ventricular Rate:  69 PR Interval:  140 QRS Duration: 96 QT Interval:  360 QTC Calculation: 385 R Axis:   73 Text Interpretation: Normal sinus rhythm Nonspecific ST abnormality Abnormal ECG No significant change since last tracing Confirmed by [id number] [phone]) on 12/29/2020 12:16:22 AM        Radiology CT ABDOMEN PELVIS W CONTRAST  Result Date: 12/29/2020 CLINICAL DATA:  Pt reports epigastric pain and vomiting began at 1400 today PMX - gastric bypass / gallbladder removed. Bowel obstruction suspected EXAM: CT  ABDOMEN AND PELVIS WITH CONTRAST TECHNIQUE: Multidetector CT imaging of the abdomen and pelvis was performed using the standard protocol following bolus administration of intravenous contrast. CONTRAST:  100mL OMNIPAQUE IOHEXOL 300 MG/ML  SOLN COMPARISON:  CT abdomen pelvis 04/13/2013, CT abdomen pelvis 06/01/2016 FINDINGS: Lower chest: No acute abnormality.  Tiny hiatal hernia. Hepatobiliary: Vague hypodensity along the false form ligament likely represents focal fatty infiltration. Otherwise no focal liver abnormality. Status post cholecystectomy. No biliary dilatation.  Pancreas: No focal lesion. Normal pancreatic contour. No surrounding inflammatory changes. No main pancreatic ductal dilatation. Spleen: Normal in size without focal abnormality. Adrenals/Urinary Tract: No adrenal nodule bilaterally. Bilateral kidneys enhance symmetrically. No hydronephrosis. No hydroureter. The urinary bladder is unremarkable. Stomach/Bowel: Roux-en-Y gastric bypass surgical changes again noted. Stomach is within normal limits. No evidence of bowel wall thickening or dilatation. No pneumatosis. The appendix unremarkable. Interval increase of swirling of the small bowel mesentery. Vascular/Lymphatic: No portal venous gas. No mesenteric gas. Question chronically obstructed inferior mesenteric vein. The portal vein and splenic veins are patent. No abdominal aorta or iliac aneurysm. Mild atherosclerotic plaque of the aorta and its branches. Interval development of stranding of the small bowel mesentery within the inferior abdomen/pelvis with associated prominent lymph nodes that were noted on prior study. No abdominal, pelvic, or inguinal lymphadenopathy. Reproductive: Prostate is unremarkable. Other: No intraperitoneal free fluid. No intraperitoneal free gas. No organized fluid collection. Musculoskeletal: No abdominal wall hernia or abnormality. No suspicious lytic or blastic osseous lesions. No acute displaced fracture. Disc bulge at the L4-L5 level , please see separately dictated outpatient CT lumbar spine 12/28/2020. IMPRESSION: 1. Increased swirling of the small bowel mesentery with associated interval development of haziness of the associated mesentery concerning for mesenteric volvulus. No CT findings of associated bowel obstruction. No definite CT findings of bowel ischemia. Recommend correlation with lactate levels and surgical consultation. 2. Question chronic obstruction of the superior mesenteric vein. 3. Tiny hiatal hernia. 4. Status post cholecystectomy and Roux-en-Y gastric bypass. 5.  Disc bulge at the L4-L5 level , please see separately dictated outpatient CT lumbar spine 12/28/2020. These results were called by telephone at the time of interpretation on 12/29/2020 at 1:22 am to provider [facility] Marlowe Cinquemani , who verbally acknowledged these results. Electronically Signed   By: Tish FredericksonMorgane  Naveau M.D.   On: 12/29/2020 01:48    Pertinent labs & imaging results that were available during my care of the patient were reviewed by me and considered in my medical decision making (see chart for details).  Medications Ordered in ED Medications  sodium chloride 0.9 % bolus 1,000 mL (0 mLs Intravenous Stopped 12/29/20 0220)    Followed by  0.9 %  sodium chloride infusion (1,000 mLs Intravenous New Bag/Given 12/29/20 0218)  HYDROmorphone (DILAUDID) injection 1 mg (has no administration in time range)  ondansetron (ZOFRAN) injection 4 mg (4 mg Intravenous Given 12/29/20 0021)  iohexol (OMNIPAQUE) 300 MG/ML solution 100 mL (100 mLs Intravenous Contrast Given 12/29/20 0101)  fentaNYL (SUBLIMAZE) injection 100 mcg (100 mcg Intravenous Given 12/29/20 0132)  HYDROmorphone (DILAUDID) injection 1 mg (1 mg Intravenous Given 12/29/20 0231)  ondansetron (ZOFRAN) injection 4 mg (4 mg Intravenous Given 12/29/20 0231)  Procedures .1-3 Lead EKG Interpretation  Date/Time: 12/29/2020 2:48 AM Performed by: Nira Conn, MD Authorized by: Nira Conn, MD     Interpretation: normal     ECG rate:  78   ECG rate assessment: normal     Rhythm: sinus rhythm     Ectopy: none     Conduction: normal   .Critical Care  Date/Time: 12/29/2020 2:49 AM Performed by: Nira Conn, MD Authorized by: Nira Conn, MD   Critical care provider statement:    Critical care time (minutes):  45   Critical care was necessary to treat or prevent imminent or  life-threatening deterioration of the following conditions: mesenteric volvulus.   Critical care was time spent personally by me on the following activities:  Discussions with consultants, evaluation of patient's response to treatment, examination of patient, ordering and performing treatments and interventions, ordering and review of laboratory studies, ordering and review of radiographic studies, pulse oximetry, re-evaluation of patient's condition, obtaining history from patient or surrogate and review of old charts   Care discussed with: admitting provider and accepting provider at another facility    (including critical care time)  Medical Decision Making / ED Course I have reviewed the nursing notes for this encounter and the patient's prior records (if available in EHR or on provided paperwork).   Robert Hess was evaluated in Emergency Department on 12/29/2020 for the symptoms described in the history of present illness. He was evaluated in the context of the global COVID-19 pandemic, which necessitated consideration that the patient might be at risk for infection with the SARS-CoV-2 virus that causes COVID-19. Institutional protocols and algorithms that pertain to the evaluation of patients at risk for COVID-19 are in a state of rapid change based on information released by regulatory bodies including the CDC and federal and state organizations. These policies and algorithms were followed during the patient's care in the ED.  Work-up notable for mesenteric volvulus noted on CT. Labs are reassuring without leukocytosis or anemia.  No significant electrolyte derangements or renal sufficiency.  Lactic acid normal. Treated with IV pain medicine and fluids.  Consulted Dr. Fredricka Bonine from general surgery who accepted the patient as an ED to ED transfer for surgical intervention at [facility].  ED team at [facility] informed of transfer and will be awaiting his arrival.      Final Clinical  Impression(s) / ED Diagnoses Final diagnoses:  Volvulus (HCC)      This chart was dictated using voice recognition software.  Despite best efforts to proofread,  errors can occur which can change the documentation meaning.    Nira Conn, MD 12/29/20 [phone]

## 2020-12-29 NOTE — Transfer of Care (Signed)
Immediate Anesthesia Transfer of Care Note  Patient: Robert Hess  Procedure(s) Performed: Diagnostic Laparoscopy for Mesenteric Volulus  Patient Location: PACU  Anesthesia Type:General  Level of Consciousness: drowsy  Airway & Oxygen Therapy: Patient Spontanous Breathing and Patient connected to face mask oxygen  Post-op Assessment: Report given to RN and Post -op Vital signs reviewed and stable  Post vital signs: Reviewed and stable  Last Vitals:  Vitals Value Taken Time  BP    Temp    Pulse    Resp    SpO2      Last Pain:  Vitals:   12/29/20 0350  TempSrc:   PainSc: 8          Complications: No notable events documented.

## 2020-12-29 NOTE — ED Notes (Signed)
Pt has been transferred to  ED [place] via [facility]

## 2020-12-29 NOTE — ED Triage Notes (Signed)
Pt reports epigastric pain and vomiting  began at 1400 today  PMX  -  gastric bypass /  gallbladder removed

## 2020-12-29 NOTE — Anesthesia Preprocedure Evaluation (Signed)
Anesthesia Evaluation  Patient identified by MRN, date of birth, ID band Patient awake    Reviewed: Allergy & Precautions, NPO status , Patient's Chart, lab work & pertinent test results  Airway Mallampati: II  TM Distance: >3 FB Neck ROM: Full    Dental  (+) Teeth Intact, Dental Advisory Given   Pulmonary former smoker,    breath sounds clear to auscultation       Cardiovascular  Rhythm:Regular Rate:Normal     Neuro/Psych    GI/Hepatic   Endo/Other    Renal/GU      Musculoskeletal   Abdominal   Peds  Hematology   Anesthesia Other Findings   Reproductive/Obstetrics                             Anesthesia Physical Anesthesia Plan  ASA: 3 and emergent  Anesthesia Plan: General   Post-op Pain Management:    Induction: Intravenous, Cricoid pressure planned and Rapid sequence  PONV Risk Score and Plan: Ondansetron and Dexamethasone  Airway Management Planned: Oral ETT  Additional Equipment:   Intra-op Plan:   Post-operative Plan: Extubation in OR  Informed Consent: I have reviewed the patients History and Physical, chart, labs and discussed the procedure including the risks, benefits and alternatives for the proposed anesthesia with the patient or authorized representative who has indicated his/her understanding and acceptance.     Dental advisory given  Plan Discussed with: CRNA and Anesthesiologist  Anesthesia Plan Comments:         Anesthesia Quick Evaluation

## 2020-12-29 NOTE — ED Notes (Signed)
Report given to  [facility] RN - charge nurse ED [place]

## 2020-12-29 NOTE — ED Notes (Signed)
Called [facility] to advise patient is going ED to ED

## 2020-12-29 NOTE — Anesthesia Procedure Notes (Addendum)
Procedure Name: Intubation Date/Time: 12/29/2020 5:02 AM Performed by: Claudina Lick, CRNA Pre-anesthesia Checklist: Patient identified, Emergency Drugs available, Suction available and Patient being monitored Patient Re-evaluated:Patient Re-evaluated prior to induction Oxygen Delivery Method: Circle system utilized Preoxygenation: Pre-oxygenation with 100% oxygen Induction Type: IV induction Laryngoscope Size: Miller and 2 Grade View: Grade I Tube type: Oral Tube size: 7.5 mm Number of attempts: 1 Airway Equipment and Method: Stylet Placement Confirmation: ETT inserted through vocal cords under direct vision, positive ETCO2 and breath sounds checked- equal and bilateral Secured at: 23 cm Tube secured with: Tape Dental Injury: Teeth and Oropharynx as per pre-operative assessment

## 2020-12-29 NOTE — Progress Notes (Signed)
Critical lab value Lactic Acid of  2.3 surgical team was notified.

## 2020-12-29 NOTE — ED Provider Notes (Signed)
Patient transferred from [facility] with abdominal pain and CT suggesting mesenteric volvulus.  He is complaining of upper abdominal pain, but abdomen is soft.  Dr. Doylene Canard of general surgery service is here to see the patient.   Dione Booze, MD 12/29/20 [phone]

## 2020-12-29 NOTE — Op Note (Addendum)
Operative Note  Robert Hess  [id number]  [id number]  12/29/2020   Surgeon: Phylliss Blakes MD FACS   Procedure performed: Diagnostic laparoscopy, detorsion of mesenteric volvulus   Preop diagnosis: Mesenteric volvulus Post-op diagnosis/intraop findings: Same, long mobile mesentery and bowel seem to be essentially volvulized around its own mesentery without any specific adhesive band.  He has an antecolic antigastric Roux limb with its mesentery oriented towards the patient's right side/superiorly and the jejunojejunostomy mesenteric closure is intact.   Specimens: no Retained items: no  EBL: minimal cc Complications: none   Description of procedure: After obtaining informed consent the patient was taken to the operating room and placed supine on operating room table where general endotracheal anesthesia was initiated, preoperative antibiotics were administered, SCDs applied, and a formal timeout was performed.  Foley catheter was inserted which is removed at the end of the case.  The abdomen was then prepped and draped in usual sterile fashion.  After infiltration with local, peritoneal access was gained using a optical entry at the left subcostal margin and insufflation to 15 mmHg ensued without incident.  On initial inspection, the majority of the small bowel appears dusky to blue.  2 additional 5 mm trochars were placed in the left hemiabdomen after infiltration with local.  The patient was placed in Trendelenburg with some left-sided tilt and the cecum and terminal ileum identified.  This was traced proximally and noted to go underneath the rest of the small bowel mesentery cephalad, with the appearance of the small bowel essentially twisted on its own mesentery.  The terminal ileum was continued to be followed proximally until the volvulus was completely reduced.  The small bowel immediately appeared much more pink and viable upon reduction.  We followed the bowel until we reached the  jejunojejunostomy which appears widely patent with an intact mesenteric closure.  This was followed proximally along the Roux limb which is positioned in an antecolic antigastric location with the mesentery oriented cephalad/to the patient's right.  We returned to the JJ and followed the BP limb proximally, this appears to cross behind the Roux limb from patient's right to left to reach the ligament of Treitz.  The Vonita Moss space is not closed, but given the configuration of the BP limb and Roux limb in order to close this and keep the bowel from twisting would require an anastomotic revision therefore this was left alone.  The bowel was once again run and inspected and the mesentery all appeared to be untwisted and the bowel appeared viable.  The abdomen was thus desufflated and the trochars removed.  Skin incisions were closed with subcuticular 4-0 Monocryl and Dermabond.  The patient was then awakened, extubated and taken to PACU in stable condition.    All counts were correct at the completion of the case.

## 2020-12-29 NOTE — H&P (Signed)
Surgical Evaluation  Chief Complaint: abdominal pain  HPI: 40 year old male with history antecolic antigastric Roux-en-Y gastric bypass by Dr. Lily Peer at [facility] in 2016 (along with cholecystectomy at the time, had previously had a lap band and subsequent removal) who presented to the med center at [facility] this evening with gradual onset right upper quadrant abdominal pain which has been worsening over the course of the preceding 8 hours (started at 2pm).  The pain has been fluctuating in intensity and is associated with nausea but no emesis.  No bowel movement, no flatus since onset of symptoms.  No known fevers or suspicious food intake. The pain has continued to worsen throughout his time in the ER.   Allergies  Allergen Reactions   Other Hives    *Rose Hair on a spider* (tarantula hair)   Nsaids     Had gastric bypass and unable to take.   Oxycodone Nausea Only    Past Medical History:  Diagnosis Date   Back pain     Past Surgical History:  Procedure Laterality Date   GASTRIC BYPASS     HERNIA REPAIR     LAPAROSCOPIC GASTRIC BANDING      History reviewed. No pertinent family history.  Social History   Socioeconomic History   Marital status: Married    Spouse name: Not on file   Number of children: Not on file   Years of education: Not on file   Highest education level: Not on file  Occupational History   Not on file  Tobacco Use   Smoking status: Former    Pack years: 0.00   Smokeless tobacco: Never  Vaping Use   Vaping Use: Never used  Substance and Sexual Activity   Alcohol use: Yes    Comment: occ   Drug use: No   Sexual activity: Not on file  Other Topics Concern   Not on file  Social History Narrative   Not on file   Social Determinants of Health   Financial Resource Strain: Not on file  Food Insecurity: Not on file  Transportation Needs: Not on file  Physical Activity: Not on file  Stress: Not on file  Social Connections: Not on file     No current facility-administered medications on file prior to encounter.   Current Outpatient Medications on File Prior to Encounter  Medication Sig Dispense Refill   acetaminophen (TYLENOL) 500 MG tablet Take 1,000 mg by mouth every 6 (six) hours as needed for mild pain, moderate pain, fever or headache.     cyclobenzaprine (FLEXERIL) 10 MG tablet Take 1 tablet (10 mg total) by mouth 3 (three) times daily as needed for muscle spasms. 30 tablet 0   FLUoxetine HCl 60 MG TABS Take 60 mg by mouth daily.      gabapentin (NEURONTIN) 300 MG capsule Take 300 mg by mouth 3 (three) times daily.     lansoprazole (PREVACID) 30 MG capsule Take 30 mg by mouth daily.     methylphenidate (RITALIN LA) 20 MG 24 hr capsule Take 20 mg by mouth every morning.     Multiple Vitamins tablet Take 1 tablet by mouth daily.     traMADol (ULTRAM) 50 MG tablet Take 1 tablet (50 mg total) by mouth every 6 (six) hours as needed. 15 tablet 0    Review of Systems: a complete, 10pt review of systems was completed with pertinent positives and negatives as documented in the HPI  Physical Exam: Vitals:   12/29/20 0230 12/29/20  0259  BP: (!) 147/89 (!) 140/97  Pulse: 67   Resp: (!) 30 13  Temp:    SpO2: 99% 96%   Gen: A&Ox3, no distress but appears uncomfortable Eyes: lids and conjunctivae normal, no icterus. Pupils equally round and reactive to light.  Neck: supple without mass or thyromegaly Chest: respiratory effort is normal. No crepitus or tenderness on palpation of the chest. Breath sounds equal.  Cardiovascular: RRR with palpable distal pulses, no pedal edema Gastrointestinal: soft, nondistended, diffusely tender without involuntary guarding. No mass, hepatomegaly or splenomegaly.  Lymphatic: no lymphadenopathy in the neck or groin Muscoloskeletal: no clubbing or cyanosis of the fingers.  Strength is symmetrical throughout.  Range of motion of bilateral upper and lower extremities normal without pain,  crepitation or contracture. Neuro: cranial nerves grossly intact.  Sensation intact to light touch diffusely. Psych: appropriate mood and affect, normal insight/judgment intact  Skin: warm and dry   CBC Latest Ref Rng & Units 12/29/2020 04/11/2019 06/01/2016  WBC 4.0 - 10.5 K/uL 9.9 11.5(H) 10.8(H)  Hemoglobin 13.0 - 17.0 g/dL 94.4 96.7 59.1  Hematocrit 39.0 - 52.0 % 45.0 47.7 45.5  Platelets 150 - 400 K/uL 253 241 260    CMP Latest Ref Rng & Units 12/29/2020 04/11/2019 11/12/2018  Glucose 70 - 99 mg/dL 638(G) 89 70  BUN 6 - 20 mg/dL 14 9 13   Creatinine 0.61 - 1.24 mg/dL 6.65 9.93  Sodium 135 - 145 mmol/L 136 138 142  Potassium 3.5 - 5.1 mmol/L 4.2 4.4 4.1  Chloride 98 - 111 mmol/L 102 105 108(H)  CO2 22 - 32 mmol/L 25 25 21   Calcium 8.9 - 10.3 mg/dL 5.70 9.2 9.8  Total Protein 6.5 - 8.1 g/dL 7.2 6.6 7.0  Total Bilirubin 0.3 - 1.2 mg/dL 0.5 1.0 0.4  Alkaline Phos 38 - 126 U/L 78 106 95  AST 15 - 41 U/L 15 560(H) 24  ALT 0 - 44 U/L 12 430(H) 17    No results found for: INR, PROTIME  Imaging: CT ABDOMEN PELVIS W CONTRAST  Result Date: 12/29/2020 CLINICAL DATA:  Pt reports epigastric pain and vomiting began at 1400 today PMX - gastric bypass / gallbladder removed. Bowel obstruction suspected EXAM: CT ABDOMEN AND PELVIS WITH CONTRAST TECHNIQUE: Multidetector CT imaging of the abdomen and pelvis was performed using the standard protocol following bolus administration of intravenous contrast. CONTRAST:  17.7 OMNIPAQUE IOHEXOL 300 MG/ML  SOLN COMPARISON:  CT abdomen pelvis 04/13/2013, CT abdomen pelvis 06/01/2016 FINDINGS: Lower chest: No acute abnormality.  Tiny hiatal hernia. Hepatobiliary: Vague hypodensity along the false form ligament likely represents focal fatty infiltration. Otherwise no focal liver abnormality. Status post cholecystectomy. No biliary dilatation. Pancreas: No focal lesion. Normal pancreatic contour. No surrounding inflammatory changes. No main pancreatic ductal  dilatation. Spleen: Normal in size without focal abnormality. Adrenals/Urinary Tract: No adrenal nodule bilaterally. Bilateral kidneys enhance symmetrically. No hydronephrosis. No hydroureter. The urinary bladder is unremarkable. Stomach/Bowel: Roux-en-Y gastric bypass surgical changes again noted. Stomach is within normal limits. No evidence of bowel wall thickening or dilatation. No pneumatosis. The appendix unremarkable. Interval increase of swirling of the small bowel mesentery. Vascular/Lymphatic: No portal venous gas. No mesenteric gas. Question chronically obstructed inferior mesenteric vein. The portal vein and splenic veins are patent. No abdominal aorta or iliac aneurysm. Mild atherosclerotic plaque of the aorta and its branches. Interval development of stranding of the small bowel mesentery within the inferior abdomen/pelvis with associated prominent lymph nodes that were noted on prior study.  No abdominal, pelvic, or inguinal lymphadenopathy. Reproductive: Prostate is unremarkable. Other: No intraperitoneal free fluid. No intraperitoneal free gas. No organized fluid collection. Musculoskeletal: No abdominal wall hernia or abnormality. No suspicious lytic or blastic osseous lesions. No acute displaced fracture. Disc bulge at the L4-L5 level , please see separately dictated outpatient CT lumbar spine 12/28/2020. IMPRESSION: 1. Increased swirling of the small bowel mesentery with associated interval development of haziness of the associated mesentery concerning for mesenteric volvulus. No CT findings of associated bowel obstruction. No definite CT findings of bowel ischemia. Recommend correlation with lactate levels and surgical consultation. 2. Question chronic obstruction of the superior mesenteric vein. 3. Tiny hiatal hernia. 4. Status post cholecystectomy and Roux-en-Y gastric bypass. 5. Disc bulge at the L4-L5 level , please see separately dictated outpatient CT lumbar spine 12/28/2020. These results  were called by telephone at the time of interpretation on 12/29/2020 at 1:22 am to provider [facility] , who verbally acknowledged these results. Electronically Signed   By: Tish Frederickson M.D.   On: 12/29/2020 01:48     A/P: 40 year old man with history of gastric bypass and mesenteric swirling noted on CT of the abdomen and pelvis in the setting of worsening abdominal pain.  I recommend proceeding to the operating room for diagnostic laparoscopy, possible exploratory laparotomy.  Discussed surgical technique and risks of bleeding, infection, pain, scarring, injury to intra-abdominal structures, need for bowel resection, and if so anastomotic complications, ileus, hernia, failure to resolve symptoms.  Questions welcomed and answered.  We will proceed urgently to OR.         Phylliss Blakes, MD [facility], [place]  See AMION to contact appropriate on-call provider

## 2020-12-30 ENCOUNTER — Encounter (HOSPITAL_COMMUNITY): Payer: Self-pay | Admitting: Surgery

## 2020-12-30 MED ORDER — ACETAMINOPHEN 500 MG PO TABS: 1000.0000 mg | ORAL_TABLET | Freq: Four times a day (QID) | ORAL | 0 refills | Status: DC | PRN

## 2020-12-30 MED ORDER — TRAMADOL HCL 50 MG PO TABS: 50.0000 mg | ORAL_TABLET | Freq: Four times a day (QID) | ORAL | 0 refills | Status: DC | PRN

## 2020-12-30 NOTE — Discharge Instructions (Signed)
[facility]  Please arrive at least 30 min before your appointment to complete your check in paperwork.  If you are unable to arrive 30 min prior to your appointment time we may have to cancel or reschedule you. LAPAROSCOPIC SURGERY: POST OP INSTRUCTIONS Always review your discharge instruction sheet given to you by the facility where your surgery was performed. IF YOU HAVE DISABILITY OR FAMILY LEAVE FORMS, YOU MUST BRING THEM TO THE OFFICE FOR PROCESSING.   DO NOT GIVE THEM TO YOUR DOCTOR.  PAIN CONTROL  First take acetaminophen (Tylenol) AND/or ibuprofen (Advil) to control your pain after surgery.  Follow directions on package.  Taking acetaminophen (Tylenol) and/or ibuprofen (Advil) regularly after surgery will help to control your pain and lower the amount of prescription pain medication you may need.  You should not take more than 4,000 mg (4 grams) of acetaminophen (Tylenol) in 24 hours.  You should not take ibuprofen (Advil), aleve, motrin, naprosyn or other NSAIDS if you have a history of stomach ulcers or chronic kidney disease.  A prescription for pain medication may be given to you upon discharge.  Take your pain medication as prescribed, if you still have uncontrolled pain after taking acetaminophen (Tylenol) or ibuprofen (Advil). Use ice packs to help control pain. If you need a refill on your pain medication, please contact your pharmacy.  They will contact our office to request authorization. Prescriptions will not be filled after 5pm or on week-ends.  HOME MEDICATIONS Take your usually prescribed medications unless otherwise directed.  DIET You should follow a light diet the first few days after arrival home.  Be sure to include lots of fluids daily. Avoid fatty, fried foods.   CONSTIPATION It is common to experience some constipation after surgery and if you are taking pain medication.  Increasing fluid intake and taking a stool softener (such as Colace)  will usually help or prevent this problem from occurring.  A mild laxative (Milk of Magnesia or Miralax) should be taken according to package instructions if there are no bowel movements after 48 hours.  WOUND/INCISION CARE Most patients will experience some swelling and bruising in the area of the incisions.  Ice packs will help.  Swelling and bruising can take several days to resolve.  Unless discharge instructions indicate otherwise, follow guidelines below  STERI-STRIPS - you may remove your outer bandages 48 hours after surgery, and you may shower at that time.  You have steri-strips (small skin tapes) in place directly over the incision.  These strips should be left on the skin for 7-10 days.   DERMABOND/SKIN GLUE - you may shower in 24 hours.  The glue will flake off over the next 2-3 weeks. Any sutures or staples will be removed at the office during your follow-up visit.  ACTIVITIES You may resume regular (light) daily activities beginning the next day--such as daily self-care, walking, climbing stairs--gradually increasing activities as tolerated.  You may have sexual intercourse when it is comfortable.  Refrain from any heavy lifting or straining until approved by your doctor. You may drive when you are no longer taking prescription pain medication, you can comfortably wear a seatbelt, and you can safely maneuver your car and apply brakes.  FOLLOW-UP You should see your doctor in the office for a follow-up appointment approximately 2-3 weeks after your surgery.  You should have been given your post-op/follow-up appointment when your surgery was scheduled.  If you did not receive a post-op/follow-up appointment, make sure   that you call for this appointment within a day or two after you arrive home to insure a convenient appointment time.   WHEN TO CALL YOUR DOCTOR: Fever over 101.0 Inability to urinate Continued bleeding from incision. Increased pain, redness, or drainage from the  incision. Increasing abdominal pain  The clinic staff is available to answer your questions during regular business hours.  Please don't hesitate to call and ask to speak to one of the nurses for clinical concerns.  If you have a medical emergency, go to the nearest emergency room or call 911.  A surgeon from [facility] is always on call at the hospital. [address], [address], [place], [place]  [address] ? [address] [address], [place], [place]   [address] [phone] ? [phone] ? FAX [phone]  

## 2020-12-30 NOTE — Discharge Summary (Signed)
    Patient ID: Robert Hess [id number] [birth year] 40 y.o.  Admit date: 12/29/2020 Discharge date: 12/30/2020  Admitting Diagnosis: Small bowel volvulus   Discharge Diagnosis Patient Active Problem List   Diagnosis Date Noted   Volvulus of intestine (HCC) 12/29/2020  S/p dx lap with detorsion of mesenteric volvulus  Consultants none  Reason for Admission: 40 year old male with history antecolic antigastric Roux-en-Y gastric bypass by Dr. Lily Peer at [facility] in 2016 (along with cholecystectomy at the time, had previously had a lap band and subsequent removal) who presented to the med center at [facility] this evening with gradual onset right upper quadrant abdominal pain which has been worsening over the course of the preceding 8 hours (started at 2pm).  The pain has been fluctuating in intensity and is associated with nausea but no emesis.  No bowel movement, no flatus since onset of symptoms.  No known fevers or suspicious food intake. The pain has continued to worsen throughout his time in the ER.   Procedures Diagnostic laparoscopy, detorsion of mesenteric volvulus, Dr. Fredricka Bonine 12/29/20  Hospital Course:  The patient was admitted post operatively for diet advancement and observation.  He had minimal pain that was controlled with Tylenol.  His diet was advanced as tolerated, mobilized well, and voiding well. His pain did not return.  He was stable for DC home on POD 1.  Physical Exam: Abd: soft, minimally tender, +BS, ND, incisions c/d/i  Allergies as of 12/30/2020       Reactions   Other Hives   *Rose Hair on a spider* (tarantula hair)   Nsaids    Had gastric bypass and unable to take.   Oxycodone Nausea Only        Medication List     TAKE these medications    acetaminophen 500 MG tablet Commonly known as: TYLENOL Take 2 tablets (1,000 mg total) by mouth every 6 (six) hours as needed.   CALCIUM CITRATE PO Take 1 tablet by mouth daily.   [employer] LANSOPRAZOLE  PO Take 1 capsule by mouth daily.   gabapentin 300 MG capsule Commonly known as: NEURONTIN Take 600 mg by mouth 3 (three) times daily.   Multiple Vitamins tablet Take 1 tablet by mouth daily.   traMADol 50 MG tablet Commonly known as: ULTRAM Take 1 tablet (50 mg total) by mouth every 6 (six) hours as needed (mild pain).          Follow-up Information     Delrae Alfred, MD Follow up in 3 week(s).   Specialty: Surgical Oncology Why: Call to schedule an appointment in about 3 weeks for follow up Contact information: [address] [place] [address] [phone]         Delrae Alfred, MD .   Specialty: Surgical Oncology Contact information: [address] [place] [address] [phone]         Berna Bue, MD Follow up today.   Specialty: General Surgery Why: As needed Contact information: [address] [place] [address] [phone]                 Signed: Barnetta Chapel, [facility] 12/30/2020, 10:37 AM Please see Amion for pager number during day hours 7:00am-4:30pm, 7-11:30am on Weekends

## 2020-12-30 NOTE — Plan of Care (Signed)
  Problem: Education: Goal: Knowledge of General Education information will improve Description: Including pain rating scale, medication(s)/side effects and non-pharmacologic comfort measures Outcome: Adequate for Discharge   

## 2020-12-30 NOTE — Anesthesia Postprocedure Evaluation (Signed)
Anesthesia Post Note  Patient: Robert Hess  Procedure(s) Performed: Diagnostic laparoscopy, detorsion of mesenteric volvulus (Abdomen)     Patient location during evaluation: PACU Anesthesia Type: General Level of consciousness: awake and alert Pain management: pain level controlled Vital Signs Assessment: post-procedure vital signs reviewed and stable Respiratory status: spontaneous breathing, nonlabored ventilation, respiratory function stable and patient connected to nasal cannula oxygen Cardiovascular status: blood pressure returned to baseline and stable Postop Assessment: no apparent nausea or vomiting Anesthetic complications: no   No notable events documented.  Last Vitals:  Vitals:   12/30/20 0352 12/30/20 0743  BP: (!) 102/55 (!) 103/54  Pulse: (!) 57 (!) 52  Resp: 18   Temp: 36.6 C 36.4 C  SpO2: 98% 96%    Last Pain:  Vitals:   12/30/20 1000  TempSrc:   PainSc: 0-No pain                 Beyza Bellino,Rolin COKER

## 2020-12-30 NOTE — Progress Notes (Signed)
Discharge instructions (including medications) discussed with and copy provided to patient/caregiver 

## 2021-01-18 ENCOUNTER — Other Ambulatory Visit: Payer: Self-pay | Admitting: Neurological Surgery

## 2021-01-18 DIAGNOSIS — M48061 Spinal stenosis, lumbar region without neurogenic claudication: Secondary | ICD-10-CM

## 2021-02-09 ENCOUNTER — Other Ambulatory Visit: Payer: Self-pay | Admitting: Orthopedic Surgery

## 2021-02-10 IMAGING — CT CT L SPINE W/ CM
1 of 6 series · 6 of 14 positions shown, 8 images · non-contrast
Comparison: MRI lumbar spine dated March 02, 2019.

CLINICAL DATA: Chronic low back pain radiating into the right
hip/buttocks and down the back of the right leg to the bottom of the
foot. No prior surgery.
TECHNIQUE: Contiguous axial images were obtained through the lumbar spine after
the intrathecal infusion of contrast. Coronal and sagittal
reconstructions were obtained of the axial image sets.

[Series 3: l spine soft · axial · 0.39mm/px · z∈[-317,-122]mm · 6 of 92 slices shown, 8 images]
[im 14/92  soft-tissue]
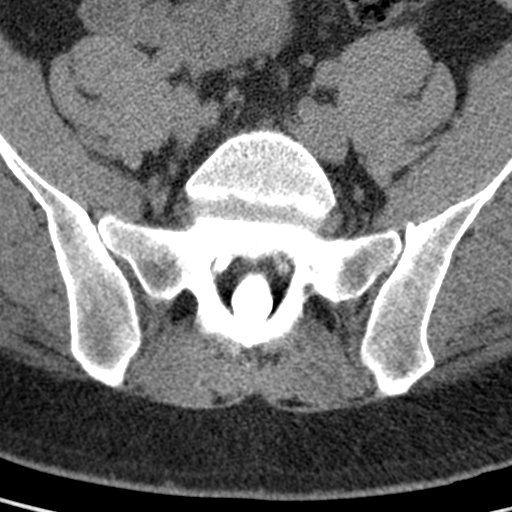
[im 14/92  bone]
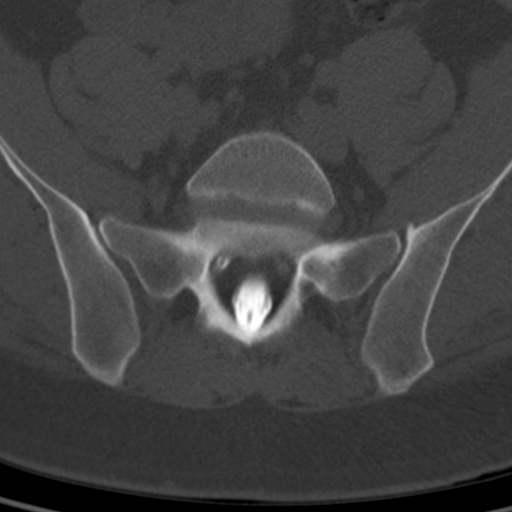
[im 27/92  bone]
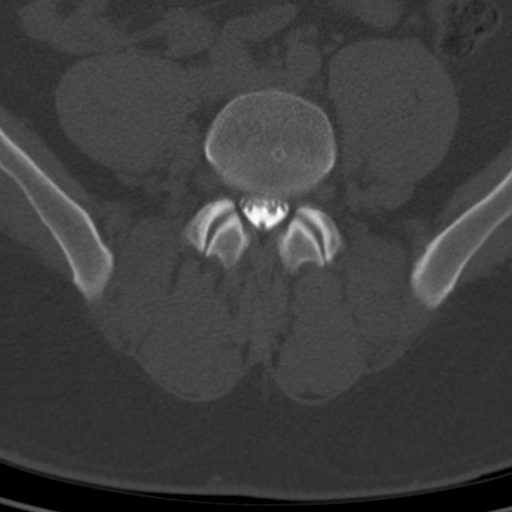
[im 40/92  bone]
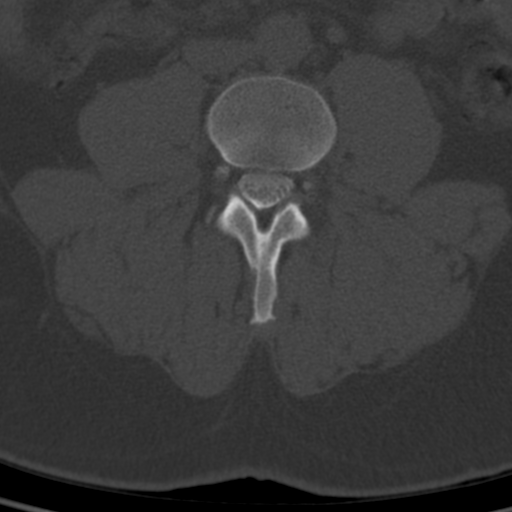
[im 53/92  bone]
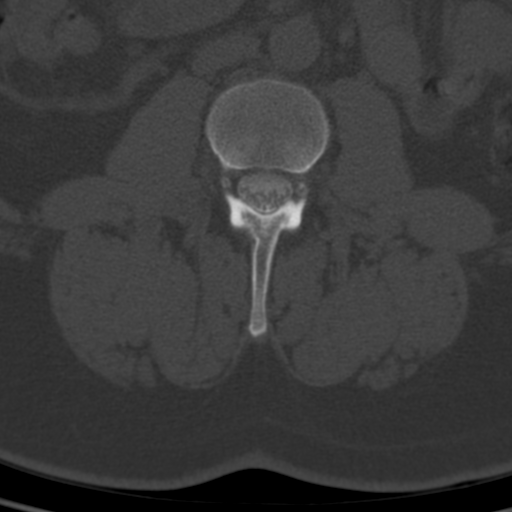
[im 66/92  soft-tissue]
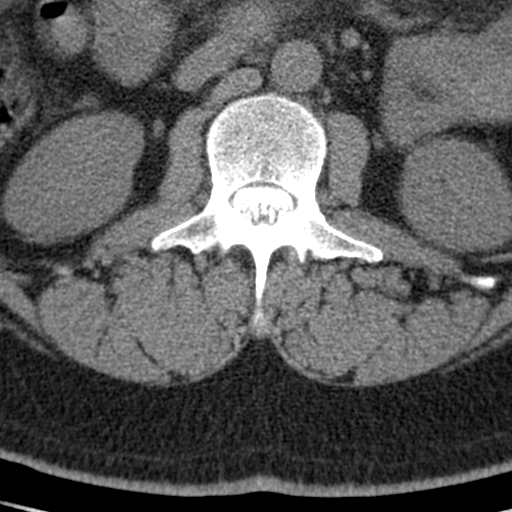
[im 66/92  bone]
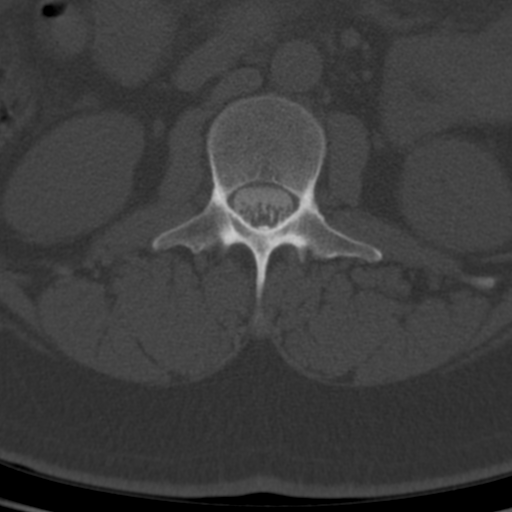
[im 79/92  bone]
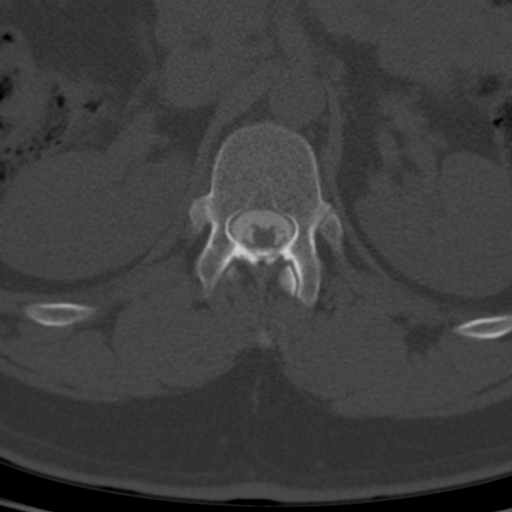

[6 of 14 positions shown; findings below may reference images not displayed]

EXAM:
LUMBAR MYELOGRAM

CT LUMBAR MYELOGRAM

FLUOROSCOPY TIME:  Radiation Exposure Index (as provided by the
fluoroscopic device): 15.2 mGy

Fluoroscopy Time:  16 seconds

Number of Acquired Images:  15

PROCEDURE:
After thorough discussion of risks and benefits of the procedure
including bleeding, infection, injury to nerves, blood vessels,
adjacent structures as well as headache and CSF leak, written and
oral informed consent was obtained. Consent was obtained by Dr.
Tzemil Vankan. Time out form was completed.

Patient was positioned prone on the fluoroscopy table. Local
anesthesia was provided with 1% lidocaine without epinephrine after
prepped and draped in the usual sterile fashion. Puncture was
performed at L3-L4 using a 5 inch 22-gauge spinal needle via right
interlaminar approach. Using a single pass through the dura, the
needle was placed within the thecal sac, with return of clear CSF.
15 mL of Isovue [id number] was injected into the thecal sac, with normal
opacification of the nerve roots and cauda equina consistent with
free flow within the subarachnoid space.

I personally performed the lumbar puncture and administered the
intrathecal contrast. I also personally supervised acquisition of
the myelogram images.
FINDINGS: LUMBAR MYELOGRAM FINDINGS:

Normal alignment. No dynamic instability. Small ventral extradural
defects from L2-L3 through L4-L5. These worsen with extension, but
improve with flexion. No spinal canal stenosis or nerve root
effacement.

CT LUMBAR MYELOGRAM FINDINGS:

Segmentation: Standard.

Alignment: Physiologic.

Vertebrae: No acute fracture or other focal pathologic process.
Small Schmorl's nodes involving the L3 superior endplate and L4
inferior endplate.

Conus medullaris and cauda equina: Conus extends to the L2 level.
Conus and cauda equina appear normal.

Paraspinal and other soft tissues: Negative.

Disc levels:

T11-T12: Negative.

T12-L1:  Negative.

L1-L2:  Negative.

L2-L3:  Unchanged minimal disc bulging.  No stenosis.

L3-L4:  Minimal disc bulging.  No stenosis.

L4-L5: Unchanged mild disc bulging with superimposed small shallow
central disc protrusions slightly migrating inferiorly. Borderline
mild bilateral lateral recess stenosis. No spinal canal or
neuroforaminal stenosis.

L5-S1:  Negative.
IMPRESSION: 1. Minimal to mild disc bulging from L2-L3 through L4-L5 worsens
with extension, but improves with flexion. No high-grade stenosis or
impingement.

## 2021-02-24 ENCOUNTER — Ambulatory Visit: Admit: 2021-02-24 | Payer: 59 | Admitting: Neurological Surgery

## 2021-02-24 SURGERY — POSTERIOR LUMBAR FUSION 1 LEVEL
Anesthesia: General | Site: Back

## 2021-03-15 ENCOUNTER — Inpatient Hospital Stay: Admit: 2021-03-15 | Payer: 59 | Admitting: Orthopedic Surgery

## 2021-03-15 SURGERY — ANTERIOR LUMBAR DISC ARTHROPLASTY
Anesthesia: General

## 2021-04-20 ENCOUNTER — Emergency Department (HOSPITAL_BASED_OUTPATIENT_CLINIC_OR_DEPARTMENT_OTHER): Payer: 59

## 2021-04-20 ENCOUNTER — Inpatient Hospital Stay: Admit: 2021-04-20 | Payer: 59 | Admitting: General Surgery

## 2021-04-20 ENCOUNTER — Encounter (HOSPITAL_COMMUNITY): Admission: EM | Disposition: A | Discharge status: Home / Self Care | Payer: Self-pay | Source: Home / Self Care

## 2021-04-20 ENCOUNTER — Emergency Department (HOSPITAL_COMMUNITY): Payer: 59 | Admitting: Certified Registered Nurse Anesthetist

## 2021-04-20 ENCOUNTER — Other Ambulatory Visit: Payer: Self-pay

## 2021-04-20 ENCOUNTER — Encounter (HOSPITAL_BASED_OUTPATIENT_CLINIC_OR_DEPARTMENT_OTHER): Payer: Self-pay | Admitting: *Deleted

## 2021-04-20 ENCOUNTER — Inpatient Hospital Stay (HOSPITAL_BASED_OUTPATIENT_CLINIC_OR_DEPARTMENT_OTHER)
Admission: EM | Admit: 2021-04-20 | Discharge: 2021-04-24 | DRG: 329 | Disposition: A | Discharge status: Home / Self Care | Payer: 59 | Attending: General Surgery | Admitting: General Surgery

## 2021-04-20 DIAGNOSIS — Z87891 Personal history of nicotine dependence: Secondary | ICD-10-CM

## 2021-04-20 DIAGNOSIS — K469 Unspecified abdominal hernia without obstruction or gangrene: Secondary | ICD-10-CM | POA: Diagnosis present

## 2021-04-20 DIAGNOSIS — K559 Vascular disorder of intestine, unspecified: Secondary | ICD-10-CM

## 2021-04-20 DIAGNOSIS — Z885 Allergy status to narcotic agent status: Secondary | ICD-10-CM | POA: Diagnosis not present

## 2021-04-20 DIAGNOSIS — Z91048 Other nonmedicinal substance allergy status: Secondary | ICD-10-CM | POA: Diagnosis not present

## 2021-04-20 DIAGNOSIS — Z886 Allergy status to analgesic agent status: Secondary | ICD-10-CM

## 2021-04-20 DIAGNOSIS — R000 Tachycardia, unspecified: Secondary | ICD-10-CM | POA: Diagnosis not present

## 2021-04-20 DIAGNOSIS — K56609 Unspecified intestinal obstruction, unspecified as to partial versus complete obstruction: Secondary | ICD-10-CM

## 2021-04-20 DIAGNOSIS — K659 Peritonitis, unspecified: Secondary | ICD-10-CM | POA: Diagnosis present

## 2021-04-20 DIAGNOSIS — K567 Ileus, unspecified: Secondary | ICD-10-CM | POA: Diagnosis not present

## 2021-04-20 DIAGNOSIS — Z20822 Contact with and (suspected) exposure to covid-19: Secondary | ICD-10-CM | POA: Diagnosis present

## 2021-04-20 DIAGNOSIS — R079 Chest pain, unspecified: Secondary | ICD-10-CM | POA: Diagnosis not present

## 2021-04-20 DIAGNOSIS — Z9884 Bariatric surgery status: Secondary | ICD-10-CM | POA: Diagnosis not present

## 2021-04-20 DIAGNOSIS — Z5331 Laparoscopic surgical procedure converted to open procedure: Secondary | ICD-10-CM

## 2021-04-20 DIAGNOSIS — K562 Volvulus: Secondary | ICD-10-CM | POA: Diagnosis present

## 2021-04-20 DIAGNOSIS — K458 Other specified abdominal hernia without obstruction or gangrene: Secondary | ICD-10-CM | POA: Diagnosis present

## 2021-04-20 HISTORY — PX: LAPAROSCOPY: SHX197

## 2021-04-20 LAB — LIPASE, BLOOD: Lipase: 44 U/L (ref 11–51)

## 2021-04-20 LAB — COMPREHENSIVE METABOLIC PANEL
ALT: 10 U/L (ref 0–44)
AST: 15 U/L (ref 15–41)
Albumin: 4.4 g/dL (ref 3.5–5.0)
Alkaline Phosphatase: 68 U/L (ref 38–126)
Anion gap: 11 (ref 5–15)
BUN: 14 mg/dL (ref 6–20)
CO2: 22 mmol/L (ref 22–32)
Calcium: 9.6 mg/dL (ref 8.9–10.3)
Chloride: 106 mmol/L (ref 98–111)
Creatinine, Ser: 0.75 mg/dL (ref 0.61–1.24)
GFR, Estimated: >60 mL/min (ref >60)
Glucose, Bld: 121 mg/dL — ABNORMAL HIGH (ref 70–99)
Potassium: 3.3 mmol/L — ABNORMAL LOW (ref 3.5–5.1)
Sodium: 139 mmol/L (ref 135–145)
Total Bilirubin: 0.6 mg/dL (ref 0.3–1.2)
Total Protein: 6.8 g/dL (ref 6.5–8.1)

## 2021-04-20 LAB — LACTIC ACID, PLASMA: Lactic Acid, Venous: 2.5 mmol/L (ref 0.5–1.9)

## 2021-04-20 LAB — RESP PANEL BY RT-PCR (FLU A&B, COVID) ARPGX2
Influenza A by PCR: NEGATIVE
Influenza B by PCR: NEGATIVE
SARS Coronavirus 2 by RT PCR: NEGATIVE

## 2021-04-20 LAB — CBC WITH DIFFERENTIAL/PLATELET
Abs Immature Granulocytes: 0.01 10*3/uL (ref 0.00–0.07)
Basophils Absolute: 0 10*3/uL (ref 0.0–0.1)
Basophils Relative: 1 %
Eosinophils Absolute: 0.1 10*3/uL (ref 0.0–0.5)
Eosinophils Relative: 1 %
HCT: 42.5 % (ref 39.0–52.0)
Hemoglobin: 14.3 g/dL (ref 13.0–17.0)
Immature Granulocytes: 0 %
Lymphocytes Relative: 47 %
Lymphs Abs: 3.7 10*3/uL (ref 0.7–4.0)
MCH: 29.3 pg (ref 26.0–34.0)
MCHC: 33.6 g/dL (ref 30.0–36.0)
MCV: 87.1 fL (ref 80.0–100.0)
Monocytes Absolute: 0.8 10*3/uL (ref 0.1–1.0)
Monocytes Relative: 10 %
Neutro Abs: 3.1 10*3/uL (ref 1.7–7.7)
Neutrophils Relative %: 41 %
Platelets: 266 10*3/uL (ref 150–400)
RBC: 4.88 MIL/uL (ref 4.22–5.81)
RDW: 13.6 % (ref 11.5–15.5)
WBC: 7.7 10*3/uL (ref 4.0–10.5)
nRBC: 0 % (ref 0.0–0.2)

## 2021-04-20 SURGERY — LAPAROSCOPY, DIAGNOSTIC
Anesthesia: General | Site: Abdomen

## 2021-04-20 MED ORDER — LIDOCAINE HCL (PF) 2 % IJ SOLN
INTRAMUSCULAR | Status: AC
  Filled 2021-04-20: qty 5

## 2021-04-20 MED ORDER — BUPIVACAINE-EPINEPHRINE 0.25% -1:200000 IJ SOLN
INTRAMUSCULAR | Status: AC
  Filled 2021-04-20: qty 1

## 2021-04-20 MED ORDER — MIDAZOLAM HCL 2 MG/2ML IJ SOLN
INTRAMUSCULAR | Status: DC | PRN
Start: 2021-04-20 — End: 2021-04-20
  Administered 2021-04-20: 2 mg via INTRAVENOUS

## 2021-04-20 MED ORDER — ACETAMINOPHEN 160 MG/5ML PO SOLN: 325.0000 mg | ORAL | Status: DC | PRN

## 2021-04-20 MED ORDER — FENTANYL CITRATE (PF) 250 MCG/5ML IJ SOLN
INTRAMUSCULAR | Status: AC
  Filled 2021-04-20: qty 5

## 2021-04-20 MED ORDER — HYDROMORPHONE HCL 1 MG/ML IJ SOLN
1.0000 mg | INTRAMUSCULAR | Status: AC | PRN
  Administered 2021-04-20 (×2): 1 mg via INTRAVENOUS
  Filled 2021-04-20 (×2): qty 1

## 2021-04-20 MED ORDER — SODIUM CHLORIDE 0.9 % IV BOLUS
1000.0000 mL | Freq: Once | INTRAVENOUS | Status: AC
  Administered 2021-04-20: 1000 mL via INTRAVENOUS

## 2021-04-20 MED ORDER — MEPERIDINE HCL 50 MG/ML IJ SOLN: 6.2500 mg | INTRAMUSCULAR | Status: DC | PRN

## 2021-04-20 MED ORDER — PROMETHAZINE HCL 25 MG/ML IJ SOLN: 6.2500 mg | INTRAMUSCULAR | Status: DC | PRN

## 2021-04-20 MED ORDER — ONDANSETRON HCL 4 MG/2ML IJ SOLN: 4.0000 mg | Freq: Once | INTRAMUSCULAR | Status: AC | PRN

## 2021-04-20 MED ORDER — ONDANSETRON HCL 4 MG/2ML IJ SOLN
4.0000 mg | Freq: Once | INTRAMUSCULAR | Status: AC
  Administered 2021-04-20: 4 mg via INTRAVENOUS
  Filled 2021-04-20: qty 2

## 2021-04-20 MED ORDER — ACETAMINOPHEN 325 MG PO TABS
325.0000 mg | ORAL_TABLET | ORAL | Status: DC | PRN
Start: 2021-04-20 — End: 2021-04-21

## 2021-04-20 MED ORDER — FENTANYL CITRATE PF 50 MCG/ML IJ SOSY
25.0000 ug | PREFILLED_SYRINGE | INTRAMUSCULAR | Status: DC | PRN
  Administered 2021-04-21: 50 ug via INTRAVENOUS

## 2021-04-20 MED ORDER — ONDANSETRON HCL 4 MG/2ML IJ SOLN
INTRAMUSCULAR | Status: AC
  Administered 2021-04-21: 4 mg via INTRAVENOUS
  Filled 2021-04-20: qty 2

## 2021-04-20 MED ORDER — KETAMINE HCL 10 MG/ML IJ SOLN
INTRAMUSCULAR | Status: AC
  Filled 2021-04-20: qty 1

## 2021-04-20 MED ORDER — ONDANSETRON HCL 4 MG/2ML IJ SOLN
INTRAMUSCULAR | Status: AC
  Administered 2021-04-20: 4 mg
  Filled 2021-04-20: qty 2

## 2021-04-20 MED ORDER — LACTATED RINGERS IR SOLN
Status: DC | PRN
  Administered 2021-04-20: 1000 mL

## 2021-04-20 MED ORDER — FENTANYL CITRATE PF 50 MCG/ML IJ SOSY
50.0000 ug | PREFILLED_SYRINGE | Freq: Once | INTRAMUSCULAR | Status: AC
Start: 2021-04-20 — End: 2021-04-20
  Administered 2021-04-20: 50 ug via INTRAVENOUS

## 2021-04-20 MED ORDER — OXYCODONE HCL 5 MG PO TABS: 5.0000 mg | ORAL_TABLET | Freq: Once | ORAL | Status: DC | PRN

## 2021-04-20 MED ORDER — HYDROMORPHONE HCL 1 MG/ML IJ SOLN
1.0000 mg | Freq: Once | INTRAMUSCULAR | Status: AC
  Administered 2021-04-20: 1 mg via INTRAVENOUS
  Filled 2021-04-20: qty 1

## 2021-04-20 MED ORDER — LORAZEPAM 2 MG/ML IJ SOLN: 1.0000 mg | Freq: Once | INTRAMUSCULAR | Status: AC

## 2021-04-20 MED ORDER — HYDROMORPHONE HCL 1 MG/ML IJ SOLN: 1.0000 mg | Freq: Once | INTRAMUSCULAR | Status: DC

## 2021-04-20 MED ORDER — FENTANYL CITRATE (PF) 250 MCG/5ML IJ SOLN
INTRAMUSCULAR | Status: DC | PRN
  Administered 2021-04-20: 50 ug via INTRAVENOUS
  Administered 2021-04-20 (×2): 100 ug via INTRAVENOUS
  Administered 2021-04-20: 50 ug via INTRAVENOUS
  Administered 2021-04-20 (×2): 100 ug via INTRAVENOUS

## 2021-04-20 MED ORDER — BUPIVACAINE-EPINEPHRINE 0.25% -1:200000 IJ SOLN
INTRAMUSCULAR | Status: DC | PRN
  Administered 2021-04-20: 8 mL

## 2021-04-20 MED ORDER — OXYCODONE HCL 5 MG/5ML PO SOLN
5.0000 mg | Freq: Once | ORAL | Status: DC | PRN
Start: 2021-04-20 — End: 2021-04-21

## 2021-04-20 MED ORDER — ONDANSETRON HCL 4 MG/2ML IJ SOLN: 4.0000 mg | Freq: Once | INTRAMUSCULAR | Status: AC

## 2021-04-20 MED ORDER — FENTANYL CITRATE PF 50 MCG/ML IJ SOSY
PREFILLED_SYRINGE | INTRAMUSCULAR | Status: AC
  Filled 2021-04-20: qty 1

## 2021-04-20 MED ORDER — MIDAZOLAM HCL 2 MG/2ML IJ SOLN
INTRAMUSCULAR | Status: AC
  Filled 2021-04-20: qty 2

## 2021-04-20 MED ORDER — 0.9 % SODIUM CHLORIDE (POUR BTL) OPTIME
TOPICAL | Status: DC | PRN
  Administered 2021-04-20: 3000 mL

## 2021-04-20 MED ORDER — PROPOFOL 10 MG/ML IV BOLUS
INTRAVENOUS | Status: AC
  Filled 2021-04-20: qty 40

## 2021-04-20 MED ORDER — LIDOCAINE HCL (CARDIAC) PF 100 MG/5ML IV SOSY
PREFILLED_SYRINGE | INTRAVENOUS | Status: DC | PRN
  Administered 2021-04-20: 100 mg via INTRAVENOUS

## 2021-04-20 MED ORDER — KETAMINE HCL 10 MG/ML IJ SOLN
INTRAMUSCULAR | Status: DC | PRN
  Administered 2021-04-20: 40 mg via INTRAVENOUS

## 2021-04-20 MED ORDER — ROCURONIUM BROMIDE 10 MG/ML (PF) SYRINGE
PREFILLED_SYRINGE | INTRAVENOUS | Status: AC
  Filled 2021-04-20: qty 10

## 2021-04-20 MED ORDER — PROPOFOL 10 MG/ML IV BOLUS
INTRAVENOUS | Status: DC | PRN
  Administered 2021-04-20: 200 mg via INTRAVENOUS

## 2021-04-20 MED ORDER — LORAZEPAM 2 MG/ML IJ SOLN
INTRAMUSCULAR | Status: AC
  Administered 2021-04-20: 1 mg via INTRAVENOUS
  Filled 2021-04-20: qty 1

## 2021-04-20 MED ORDER — IOHEXOL 300 MG/ML  SOLN
100.0000 mL | Freq: Once | INTRAMUSCULAR | Status: AC | PRN
  Administered 2021-04-20: 100 mL via INTRAVENOUS

## 2021-04-20 MED ORDER — DEXAMETHASONE SODIUM PHOSPHATE 10 MG/ML IJ SOLN
INTRAMUSCULAR | Status: DC | PRN
  Administered 2021-04-20: 8 mg via INTRAVENOUS

## 2021-04-20 MED ORDER — SUGAMMADEX SODIUM 200 MG/2ML IV SOLN
INTRAVENOUS | Status: DC | PRN
  Administered 2021-04-20: 200 mg via INTRAVENOUS

## 2021-04-20 MED ORDER — LACTATED RINGERS IV SOLN: INTRAVENOUS | Status: DC | PRN

## 2021-04-20 MED ORDER — ROCURONIUM BROMIDE 10 MG/ML (PF) SYRINGE
PREFILLED_SYRINGE | INTRAVENOUS | Status: DC | PRN
  Administered 2021-04-20: 10 mg via INTRAVENOUS
  Administered 2021-04-20: 100 mg via INTRAVENOUS
  Administered 2021-04-20: 30 mg via INTRAVENOUS
  Administered 2021-04-20: 20 mg via INTRAVENOUS
  Administered 2021-04-20: 10 mg via INTRAVENOUS

## 2021-04-20 MED ORDER — PROMETHAZINE HCL 25 MG/ML IJ SOLN
INTRAMUSCULAR | Status: AC
  Administered 2021-04-20: 12.5 mg
  Filled 2021-04-20: qty 1

## 2021-04-20 MED ORDER — FENTANYL CITRATE PF 50 MCG/ML IJ SOSY
50.0000 ug | PREFILLED_SYRINGE | INTRAMUSCULAR | Status: DC | PRN
  Administered 2021-04-20 (×2): 50 ug via INTRAVENOUS
  Filled 2021-04-20 (×3): qty 1

## 2021-04-20 MED ORDER — PIPERACILLIN-TAZOBACTAM 3.375 G IVPB 30 MIN
3.3750 g | Freq: Once | INTRAVENOUS | Status: AC
  Administered 2021-04-20: 3.375 g via INTRAVENOUS
  Filled 2021-04-20: qty 50

## 2021-04-20 SURGICAL SUPPLY — 41 items
APL PRP STRL LF DISP 70% ISPRP (MISCELLANEOUS) ×2
BLADE EXTENDED COATED 6.5IN (ELECTRODE) ×2 IMPLANT
BRR ADH 5X3 SEPRAFILM 6 SHT (MISCELLANEOUS) ×2
CHLORAPREP W/TINT 26 (MISCELLANEOUS) ×3 IMPLANT
COUNTER NEEDLE 20 DBL MAG RED (NEEDLE) ×3 IMPLANT
COVER MAYO STAND STRL (DRAPES) ×9 IMPLANT
DRAPE LAPAROSCOPIC ABDOMINAL (DRAPES) ×3 IMPLANT
DRAPE SHEET LG 3/4 BI-LAMINATE (DRAPES) ×2 IMPLANT
DRSG OPSITE POSTOP 4X12 (GAUZE/BANDAGES/DRESSINGS) ×2 IMPLANT
DRSG TELFA 3X8 NADH (GAUZE/BANDAGES/DRESSINGS) ×3 IMPLANT
ELECT REM PT RETURN 15FT ADLT (MISCELLANEOUS) ×3 IMPLANT
GLOVE ORTHOPEDIC STR SZ6.5 (GLOVE) ×2 IMPLANT
GLOVE SRG 8 PF TXTR STRL LF DI (GLOVE) ×2 IMPLANT
GLOVE SURG ENC MOIS LTX SZ6.5 (GLOVE) ×2 IMPLANT
GLOVE SURG POLY ORTHO LF SZ7.5 (GLOVE) ×6 IMPLANT
GLOVE SURG UNDER POLY LF SZ8 (GLOVE) ×3
GOWN STRL REUS W/TWL XL LVL3 (GOWN DISPOSABLE) ×10 IMPLANT
IRRIG SUCT STRYKERFLOW 2 WTIP (MISCELLANEOUS) ×3
IRRIGATION SUCT STRKRFLW 2 WTP (MISCELLANEOUS) ×2 IMPLANT
KIT TURNOVER KIT A (KITS) ×3 IMPLANT
LEGGING LITHOTOMY PAIR STRL (DRAPES) IMPLANT
PACK COLON (CUSTOM PROCEDURE TRAY) ×3 IMPLANT
PAD DRESSING TELFA 3X8 NADH (GAUZE/BANDAGES/DRESSINGS) IMPLANT
PAD POSITIONING PINK XL (MISCELLANEOUS) ×4 IMPLANT
PENCIL SMOKE EVACUATOR (MISCELLANEOUS) ×2 IMPLANT
PROTECTOR NERVE ULNAR (MISCELLANEOUS) ×2 IMPLANT
RETRACTOR WND ALEXIS 25 LRG (MISCELLANEOUS) ×1 IMPLANT
RTRCTR WOUND ALEXIS 25CM LRG (MISCELLANEOUS) ×3
SEPRAFILM PROCEDURAL PACK 3X5 (MISCELLANEOUS) ×2 IMPLANT
SET TUBE SMOKE EVAC HIGH FLOW (TUBING) ×3 IMPLANT
SHEARS HARMONIC ACE PLUS 36CM (ENDOMECHANICALS) ×3 IMPLANT
SLEEVE XCEL OPT CAN 5 100 (ENDOMECHANICALS) ×6 IMPLANT
SPONGE T-LAP 18X18 ~~LOC~~+RFID (SPONGE) ×4 IMPLANT
STAPLER VISISTAT 35W (STAPLE) ×3 IMPLANT
SUT PDS AB 1 TP1 96 (SUTURE) ×4 IMPLANT
SUT SILK 2 0 SH CR/8 (SUTURE) ×3 IMPLANT
TOWEL OR 17X26 10 PK STRL BLUE (TOWEL DISPOSABLE) ×2 IMPLANT
TOWEL OR NON WOVEN STRL DISP B (DISPOSABLE) ×3 IMPLANT
TRAY FOLEY MTR SLVR 16FR STAT (SET/KITS/TRAYS/PACK) ×2 IMPLANT
TROCAR BLADELESS OPT 5 100 (ENDOMECHANICALS) ×3 IMPLANT
TUBING CONNECTING 10 (TUBING) ×2 IMPLANT

## 2021-04-20 NOTE — ED Notes (Signed)
Returned from CT.  States pain is back as bad as when he arrived. RN notified.

## 2021-04-20 NOTE — Transfer of Care (Signed)
Immediate Anesthesia Transfer of Care Note  Patient: Robert Hess  Procedure(s) Performed: DIAGNOSTIC LAPAROSCOPY, EXPLORATORY LAPAROTOMY, REDUCTION OF SMALL BOWEL VOLVULUS (Abdomen)  Patient Location: PACU  Anesthesia Type:General  Level of Consciousness: awake, drowsy and patient cooperative  Airway & Oxygen Therapy: Patient Spontanous Breathing  Post-op Assessment: Report given to RN and Post -op Vital signs reviewed and stable  Post vital signs: Reviewed and stable  Last Vitals:  Vitals Value Taken Time  BP 108/69 2323  Temp    Pulse 112 2330  Resp 18 2330  SpO2 100 2330    Last Pain:  Vitals:   04/20/21 1618  TempSrc:   PainSc: 10-Worst pain ever         Complications: No notable events documented.

## 2021-04-20 NOTE — ED Notes (Signed)
Assumed care of patient from [facility] transport. Sent over from [facility] for surgical consult. Patient thrashing and screaming and unable to be consoled complaining of acute abdominal pain rated at 10/10 and nausea. Provider at bedside.

## 2021-04-20 NOTE — ED Triage Notes (Signed)
2 days of abd pain with increased pain in mid rt today abd, N/V severe pain, ? Syncopal episode in triage waiting. History of Twisted bowel with surgical correction a few months ago

## 2021-04-20 NOTE — Anesthesia Postprocedure Evaluation (Signed)
Anesthesia Post Note  Patient: Robert Hess  Procedure(s) Performed: DIAGNOSTIC LAPAROSCOPY, EXPLORATORY LAPAROTOMY, REDUCTION OF SMALL BOWEL VOLVULUS (Abdomen)     Patient location during evaluation: PACU Anesthesia Type: General Level of consciousness: awake and alert Pain management: pain level controlled Vital Signs Assessment: post-procedure vital signs reviewed and stable Respiratory status: spontaneous breathing, nonlabored ventilation, respiratory function stable and patient connected to nasal cannula oxygen Cardiovascular status: blood pressure returned to baseline and stable Postop Assessment: no apparent nausea or vomiting Anesthetic complications: no   No notable events documented.  Last Vitals:  Vitals:   04/20/21 1840 04/20/21 2323  BP: (!) 160/86 106/74  Pulse: 86 (!) 119  Resp: 20 17  Temp:  36.6 C  SpO2: 100% 100%    Last Pain:  Vitals:   04/20/21 2323  TempSrc:   PainSc: Asleep                 Kindred Reidinger

## 2021-04-20 NOTE — Anesthesia Preprocedure Evaluation (Addendum)
Anesthesia Evaluation  Patient identified by MRN, date of birth, ID band Patient awake    Reviewed: Allergy & Precautions, NPO status , Patient's Chart, lab work & pertinent test results  Airway Mallampati: II  TM Distance: >3 FB Neck ROM: Full    Dental  (+) Teeth Intact, Dental Advisory Given   Pulmonary former smoker,    breath sounds clear to auscultation       Cardiovascular  Rhythm:Regular Rate:Normal     Neuro/Psych negative neurological ROS  negative psych ROS   GI/Hepatic negative GI ROS, Neg liver ROS,   Endo/Other  Morbid obesity  Renal/GU negative Renal ROS  negative genitourinary   Musculoskeletal negative musculoskeletal ROS (+)   Abdominal   Peds  Hematology negative hematology ROS (+)   Anesthesia Other Findings   Reproductive/Obstetrics negative OB ROS                             Anesthesia Physical  Anesthesia Plan  ASA: 3 and emergent  Anesthesia Plan: General   Post-op Pain Management:    Induction: Intravenous and Cricoid pressure planned  PONV Risk Score and Plan: 2 and Ondansetron and Dexamethasone  Airway Management Planned: Oral ETT  Additional Equipment: None  Intra-op Plan:   Post-operative Plan: Extubation in OR  Informed Consent: I have reviewed the patients History and Physical, chart, labs and discussed the procedure including the risks, benefits and alternatives for the proposed anesthesia with the patient or authorized representative who has indicated his/her understanding and acceptance.     Dental advisory given  Plan Discussed with: CRNA and Anesthesiologist  Anesthesia Plan Comments:        Anesthesia Quick Evaluation

## 2021-04-20 NOTE — ED Provider Notes (Signed)
[facility] EMERGENCY DEPT Provider Note   CSN: [id number] Arrival date & time: 04/20/21  1505     History Chief Complaint  Patient presents with   Abdominal Pain    Robert Hess is a 40 y.o. male.  Presents to ER with concern for abdominal pain.  Has been having pain over the past couple days but seems to be getting much more severe today.  Worsen right lower abdomen.  Had nausea and associated vomiting.  Pain is up to 10-10 in severity sharp and stabbing.  Had episode of feeling lightheaded and almost passed out while in triage.  Pain feels similar but worse to last episode.  Patient is diagnosed with volvulus in June 2022.  Required laparoscopic detorsion by Dr. Doylene Canard.  Patient also has history of Roux-en-Y bypass surgery in 2016.  HPI     Past Medical History:  Diagnosis Date   Back pain     Patient Active Problem List   Diagnosis Date Noted   Volvulus of intestine (HCC) 12/29/2020    Past Surgical History:  Procedure Laterality Date   ABDOMINAL SURGERY     GASTRIC BYPASS     HERNIA REPAIR     LAPAROSCOPIC GASTRIC BANDING     LAPAROSCOPIC REMOVAL OF MESENTERIC MASS N/A 12/29/2020   Procedure: Diagnostic laparoscopy, detorsion of mesenteric volvulus;  Surgeon: Berna Bue, MD;  Location: [facility] OR;  Service: General;  Laterality: N/A;       No family history on file.  Social History   Tobacco Use   Smoking status: Former   Smokeless tobacco: Never  Building services engineer Use: Never used  Substance Use Topics   Alcohol use: Not Currently    Comment: occ   Drug use: No    Home Medications Prior to Admission medications   Medication Sig Start Date End Date Taking? Authorizing Provider  acetaminophen (TYLENOL) 500 MG tablet Take 2 tablets (1,000 mg total) by mouth every 6 (six) hours as needed. 12/30/20  Yes Barnetta Chapel, PA-C  CALCIUM CITRATE PO Take 1 tablet by mouth daily.   Yes [provider]  [employer] LANSOPRAZOLE PO Take 1  capsule by mouth daily.   Yes [provider]  gabapentin (NEURONTIN) 300 MG capsule Take 600 mg by mouth 3 (three) times daily.   Yes [provider]  metFORMIN (GLUCOPHAGE) 500 MG tablet Take 500 mg by mouth daily. 03/27/21  Yes [provider]  Multiple Vitamins tablet Take 1 tablet by mouth daily.   Yes [provider]  OZEMPIC, 1 MG/DOSE, 4 MG/3ML SOPN Inject 1 mg into the skin once a week. 03/31/21  Yes [provider]  buPROPion (WELLBUTRIN) 100 MG tablet Take 100 mg by mouth daily as needed. Patient not taking: Reported on 04/20/2021 02/19/21   [provider]  traMADol (ULTRAM) 50 MG tablet Take 1 tablet (50 mg total) by mouth every 6 (six) hours as needed (mild pain). Patient not taking: No sig reported 12/30/20   Barnetta Chapel, PA-C    Allergies    Other, Nsaids, and Oxycodone  Review of Systems   Review of Systems  Constitutional:  Positive for fatigue. Negative for chills and fever.  HENT:  Negative for ear pain and sore throat.   Eyes:  Negative for pain and visual disturbance.  Respiratory:  Negative for cough and shortness of breath.   Cardiovascular:  Negative for chest pain and palpitations.  Gastrointestinal:  Positive for abdominal pain, nausea and  vomiting.  Genitourinary:  Negative for dysuria and hematuria.  Musculoskeletal:  Negative for arthralgias and back pain.  Skin:  Negative for color change and rash.  Neurological:  Positive for syncope and light-headedness. Negative for seizures.  All other systems reviewed and are negative.  Physical Exam Updated Vital Signs BP 123/77 (BP Location: Right Arm)   Pulse 65   Temp 98 F (36.7 C) (Oral)   Resp 17   Ht 6\' 2"  (1.88 m)   Wt 121.4 kg   SpO2 100%   BMI 34.36 kg/m   Physical Exam Vitals and nursing note reviewed.  Constitutional:      Appearance: He is well-developed.  HENT:     Head: Normocephalic and atraumatic.  Eyes:     Conjunctiva/sclera:  Conjunctivae normal.  Cardiovascular:     Rate and Rhythm: Normal rate and regular rhythm.     Heart sounds: No murmur heard. Pulmonary:     Effort: Pulmonary effort is normal. No respiratory distress.     Breath sounds: Normal breath sounds.  Abdominal:     Palpations: Abdomen is soft.     Tenderness: There is generalized abdominal tenderness.     Comments: There is generalized tenderness to palpation, no rebound  Musculoskeletal:     Cervical back: Neck supple.  Skin:    General: Skin is warm and dry.  Neurological:     General: No focal deficit present.     Mental Status: He is alert.    ED Results / Procedures / Treatments   Labs (all labs ordered are listed, but only abnormal results are displayed) Labs Reviewed  COMPREHENSIVE METABOLIC PANEL - Abnormal; Notable for the following components:      Result Value   Potassium 3.3 (*)    Glucose, Bld 121 (*)    All other components within normal limits  RESP PANEL BY RT-PCR (FLU A&B, COVID) ARPGX2  LIPASE, BLOOD  CBC WITH DIFFERENTIAL/PLATELET  LACTIC ACID, PLASMA  LACTIC ACID, PLASMA  CBG MONITORING, ED    EKG None  Radiology CT ABDOMEN PELVIS W CONTRAST  Addendum Date: 04/20/2021   ADDENDUM REPORT: 04/20/2021 17:20 ADDENDUM: These results were called by telephone at the time of interpretation on 04/20/2021 at 5:18 pm to provider [facility] , who verbally acknowledged these results. Electronically Signed   By: [facility] M.D.   On: 04/20/2021 17:20   Result Date: 04/20/2021 CLINICAL DATA:  Abdominal pain, acute, nonlocalized severe sudden abodminal pain; concern for obstruction, volvulus EXAM: CT ABDOMEN AND PELVIS WITH CONTRAST TECHNIQUE: Multidetector CT imaging of the abdomen and pelvis was performed using the standard protocol following bolus administration of intravenous contrast. CONTRAST:  04/22/2021 OMNIPAQUE IOHEXOL 300 MG/ML  SOLN COMPARISON:  CT abdomen pelvis 12/29/2020, CT abdomen pelvis 06/01/2016  FINDINGS: Lower chest: No acute abnormality.  Small hiatal hernia. Hepatobiliary: No focal liver abnormality. Status post cholecystectomy. No biliary dilatation. Pancreas: No focal lesion. Normal pancreatic contour. No surrounding inflammatory changes. No main pancreatic ductal dilatation. Normal in size without focal abnormality. Spleen: Normal in size without focal abnormality. Adrenals/Urinary Tract: No adrenal nodule bilaterally. Bilateral kidneys enhance symmetrically. No hydronephrosis. No hydroureter. The urinary bladder is unremarkable. Stomach/Bowel: Status post Roux-en-Y gastric bypass. Two regions of persistent swirling of the small bowel mesentery is noted (2:35, 50). Associated mesenteric edema. Stomach is within normal limits. Poor enhancement of the wall of the small bowel associated with the mid abdominal and jejunojejunostomy (5:85, 2:33). No definite pneumatosis. No evidence of bowel wall  thickening or dilatation. Appendix appears normal. Vascular/Lymphatic: No abdominal aorta or iliac aneurysm. No abdominal, pelvic, or inguinal lymphadenopathy. Reproductive: Prostate is unremarkable. Other: No intraperitoneal free fluid. No intraperitoneal free gas. No organized fluid collection. Musculoskeletal: No abdominal wall hernia or abnormality. No suspicious lytic or blastic osseous lesions. No acute displaced fracture. IMPRESSION: 1. Concern for mesenteric volvulus and small bowel ischemia in the setting of persistent mesenteric swirling, increased mesenteric edema, and interval development of non-enhancement of the small bowel associated with the mid abdominal jejunojejunostomy. No definite pneumatosis. No associated bowel obstruction or perforation. Recommend correlation with lactate levels and surgical consultation. 2. Status post Roux-en-Y gastric bypass. Electronically Signed: By: Tish Frederickson M.D. On: 04/20/2021 17:16    Procedures Procedures   Medications Ordered in ED Medications   HYDROmorphone (DILAUDID) injection 1 mg (1 mg Intravenous Given 04/20/21 1543)  piperacillin-tazobactam (ZOSYN) IVPB 3.375 g (has no administration in time range)  fentaNYL (SUBLIMAZE) injection 50 mcg (50 mcg Intravenous Given 04/20/21 1531)  ondansetron (ZOFRAN) injection 4 mg (4 mg Intravenous Given 04/20/21 1532)  sodium chloride 0.9 % bolus 1,000 mL (1,000 mLs Intravenous New Bag/Given 04/20/21 1539)  LORazepam (ATIVAN) injection 1 mg (1 mg Intravenous Given 04/20/21 1606)  iohexol (OMNIPAQUE) 300 MG/ML solution 100 mL (100 mLs Intravenous Contrast Given 04/20/21 1623)    ED Course  I have reviewed the triage vital signs and the nursing notes.  Pertinent labs & imaging results that were available during my care of the patient were reviewed by me and considered in my medical decision making (see chart for details).    MDM Rules/Calculators/A&P                          40 year old gentleman presents to ER with concern for abdominal pain.  Has history of gastric bypass, volvulus.  Obtain labs, CT imaging.  Labs grossly stable.  CT concerning for recurrent volvulus with known enhancement of some small bowel concerning for possible mesenteric ischemia.  Sent lactate levels, COVID swab, consulted general surgery.  Dr. Andrey Campanile reviewed case, he recommends transfer ER to ER to [facility].  Dr. Silverio Lay excepting.  Upon patient arrival to [facility] ER, surgery team will need to be notified.  Started antibiotics with Zosyn given the possible bowel ischemia.  Discussed findings with patient and wife at bedside. At present, pain well controlled and vital signs are stable.   Final Clinical Impression(s) / ED Diagnoses Final diagnoses:  Volvulus [facility])    Rx / DC Orders ED Discharge Orders     None        Milagros Loll, MD 04/20/21 [phone]

## 2021-04-20 NOTE — ED Notes (Signed)
[facility] at bedside 

## 2021-04-20 NOTE — Brief Op Note (Signed)
04/20/2021  11:30 PM  PATIENT:  Robert Hess  40 y.o. 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  PROCEDURE:  Procedure(s): DIAGNOSTIC LAPAROSCOPY, EXPLORATORY LAPAROTOMY, REDUCTION OF SMALL BOWEL VOLVULUS CLOSURE OF PETERSEN'S HERNIA SPACE  SURGEON:  Surgeon(s) and Role:    Gaynelle Adu, MD - Primary  PHYSICIAN ASSISTANT:   ASSISTANTS: none   ANESTHESIA:   general  EBL:  20 mL   BLOOD ADMINISTERED:none  DRAINS: none   LOCAL MEDICATIONS USED:  MARCAINE     SPECIMEN:  No Specimen  DISPOSITION OF SPECIMEN:  N/A  COUNTS:  YES  TOURNIQUET:  * No tourniquets in log *  DICTATION: .Other Dictation: Dictation Number [id number]  PLAN OF CARE: Admit to inpatient   PATIENT DISPOSITION:  PACU - hemodynamically stable.   Delay start of Pharmacological VTE agent (>24hrs) due to surgical blood loss or risk of bleeding: no

## 2021-04-20 NOTE — ED Provider Notes (Signed)
  Physical Exam  BP (!) 160/86 (BP Location: Right Arm)   Pulse 86   Temp 98 F (36.7 C) (Oral)   Resp 20   Ht 6\' 2"  (1.88 m)   Wt 121.4 kg   SpO2 100%   BMI 34.36 kg/m   Physical Exam  ED Course/Procedures     Procedures  MDM  Patient arrived at [facility] around 6:30 PM.  Patient is transferred from [facility] for volvulus with ischemia. Lactate 2.5.  Patient is writhing around in pain.  I paged [facility]. [facility] who arrived at bedside.  He will take patient emergently up to the OR for decompression.   7:42 PM Patient taken upstairs to the PACU.      Andrey Campanile, MD 04/20/21 [phone]

## 2021-04-20 NOTE — ED Notes (Signed)
Lactic Acid results 2.5 mmol/L given to Dr. Stevie Kern

## 2021-04-20 NOTE — H&P (Signed)
CC: severe stomach pain  Requesting provider: Dr Silverio Lay  HPI: Robert Hess is an 40 y.o. male who was [facility] after presenting there with severe worsening lower abdominal pain.  He has a history of laparoscopic adjustable gastric band placement which was removed in 2016 followed a few months later in November 2016 by laparoscopic Roux-en-Y antecolic antigastric gastric bypass at [facility] by Dr. Lily Peer.  His initial visit weight at [facility] was 341 pounds.  He states that he has been having a few days of postprandial abdominal pain and bloating.  It worsened last evening and acutely worsened today.  He reports subjective chills along with nausea and vomiting.  History is somewhat limited because the patient is writhing in pain on the hospital gurney clutching his abdomen  He reports that he has been taking his multivitamin calcium.  He had a small BM this morning.  He presented with similar complaints in June of this summer with concerns of mesenteric volvulus on CT imaging.  He underwent diagnostic laparoscopy and detorsion of mesenteric volvulus by one of my partners.  The small bowel had twisted on its entire pedicle.  He was found to have an antecolic antigastric Roux limb with its mesentery oriented towards the patient's right side/superiorly and the jejunojejunostomy mesenteric closure was intact.  The Delphi space was found not to be closed but given the configuration of his BP limb and Roux limb in order to close this and to keep the bowel from twisting it was felt it would require an anastomotic revision and was left alone and the patient was instructed to follow-up with [facility]  Past Medical History:  Diagnosis Date   Back pain     Past Surgical History:  Procedure Laterality Date   ABDOMINAL SURGERY     GASTRIC BYPASS     HERNIA REPAIR     LAPAROSCOPIC GASTRIC BANDING     LAPAROSCOPIC REMOVAL OF  MESENTERIC MASS N/A 12/29/2020   Procedure: Diagnostic laparoscopy, detorsion of mesenteric volvulus;  Surgeon: Berna Bue, MD;  Location: [facility] OR;  Service: General;  Laterality: N/A;    No family history on file.  Social:  reports that he has quit smoking. He has never used smokeless tobacco. He reports that he does not currently use alcohol. He reports that he does not use drugs.  Allergies:  Allergies  Allergen Reactions   Other Hives    *Rose Hair on a spider* (tarantula hair)   Nsaids     Had gastric bypass and unable to take.   Oxycodone Nausea Only    Medications: I have reviewed the patient's current medications.   ROS - all of the below systems have been reviewed with the patient and positives are indicated with bold text General: chills, fever or night sweats Eyes: blurry vision or double vision ENT: epistaxis or sore throat Allergy/Immunology: itchy/watery eyes or nasal congestion Hematologic/Lymphatic: bleeding problems, blood clots or swollen lymph nodes Endocrine: temperature intolerance or unexpected weight changes Breast: new or changing breast lumps or nipple discharge Resp: cough, shortness of breath, or wheezing CV: chest pain or dyspnea on exertion GI: as per HPI GU: dysuria, trouble voiding, or hematuria MSK: joint pain or joint stiffness Neuro: TIA or stroke symptoms Derm: pruritus and skin lesion changes Psych: anxiety and depression  PE Blood pressure (!) 160/86, pulse 86, temperature 98 F (36.7 C), temperature source Oral, resp. rate 20, height 6\' 2"  (1.88  m), weight 121.4 kg, SpO2 100 %. Constitutional: Appears very uncomfortable, writhing in the bed.  Clutching his abdomen; conversant; no deformities Eyes: Moist conjunctiva; no lid lag; anicteric; PERRL Neck: Trachea midline; no thyromegaly Lungs: Normal respiratory effort; no tactile fremitus CV: RRR; no palpable thrills; no pitting edema GI: Abd soft, very tender to palpation, positive  voluntary guarding, has peritoneal signs as well; no palpable hepatosplenomegaly MSK: Normal gait; no clubbing/cyanosis Psychiatric: Appropriate affect; alert and oriented x3 Lymphatic: No palpable cervical or axillary lymphadenopathy Skin: No rash, jaundice or lesions  Results for orders placed or performed during the hospital encounter of 04/20/21 (from the past 48 hour(s))  Comprehensive metabolic panel     Status: Abnormal   Collection Time: 04/20/21  3:27 PM  Result Value Ref Range   Sodium 139 135 - 145 mmol/L   Potassium 3.3 (L) 3.5 - 5.1 mmol/L   Chloride 106 98 - 111 mmol/L   CO2 22 22 - 32 mmol/L   Glucose, Bld 121 (H) 70 - 99 mg/dL    Comment: Glucose reference range applies only to samples taken after fasting for at least 8 hours.   BUN 14 6 - 20 mg/dL   Creatinine, Ser 4.16 0.61 - 1.24 mg/dL   Calcium 9.6 8.9 - 60.6 mg/dL   Total Protein 6.8 6.5 - 8.1 g/dL   Albumin 4.4 3.5 - 5.0 g/dL   AST 15 15 - 41 U/L   ALT 10 0 - 44 U/L   Alkaline Phosphatase 68 38 - 126 U/L   Total Bilirubin 0.6 0.3 - 1.2 mg/dL   GFR, Estimated >30 >16 mL/min    Comment: (NOTE) Calculated using the CKD-EPI Creatinine Equation (2021)    Anion gap 11 5 - 15    Comment: Performed at [facility], [address], [place], [place] [address]  Lipase, blood     Status: None   Collection Time: 04/20/21  3:27 PM  Result Value Ref Range   Lipase 44 11 - 51 U/L    Comment: Performed at [facility], [address] [place], [place] [address]  CBC with Differential     Status: None   Collection Time: 04/20/21  3:27 PM  Result Value Ref Range   WBC 7.7 4.0 - 10.5 K/uL   RBC 4.88 4.22 - 5.81 MIL/uL   Hemoglobin 14.3 13.0 - 17.0 g/dL   HCT 32.2 02.5 - 42.7 %   MCV 87.1 80.0 - 100.0 fL   MCH 29.3 26.0 - 34.0 pg   MCHC 33.6 30.0 - 36.0 g/dL   RDW 06.2 37.6 - 28.3 %   Platelets 266 150 - 400 K/uL   nRBC 0.0 0.0 - 0.2 %   Neutrophils Relative % 41 %   Neutro  Abs 3.1 1.7 - 7.7 K/uL   Lymphocytes Relative 47 %   Lymphs Abs 3.7 0.7 - 4.0 K/uL   Monocytes Relative 10 %   Monocytes Absolute 0.8 0.1 - 1.0 K/uL   Eosinophils Relative 1 %   Eosinophils Absolute 0.1 0.0 - 0.5 K/uL   Basophils Relative 1 %   Basophils Absolute 0.0 0.0 - 0.1 K/uL   Immature Granulocytes 0 %   Abs Immature Granulocytes 0.01 0.00 - 0.07 K/uL    Comment: Performed at [facility], [address] [place], [place] [address]  Resp Panel by RT-PCR (Flu A&B, Covid) Nasopharyngeal Swab     Status: None   Collection Time: 04/20/21  5:25 PM  Specimen: Nasopharyngeal Swab; Nasopharyngeal(NP) swabs in vial transport medium  Result Value Ref Range   SARS Coronavirus 2 by RT PCR NEGATIVE NEGATIVE    Comment: (NOTE) SARS-CoV-2 target nucleic acids are NOT DETECTED.  The SARS-CoV-2 RNA is generally detectable in upper respiratory specimens during the acute phase of infection. The lowest concentration of SARS-CoV-2 viral copies this assay can detect is 138 copies/mL. A negative result does not preclude SARS-Cov-2 infection and should not be used as the sole basis for treatment or other patient management decisions. A negative result may occur with  improper specimen collection/handling, submission of specimen other than nasopharyngeal swab, presence of viral mutation(s) within the areas targeted by this assay, and inadequate number of viral copies(<138 copies/mL). A negative result must be combined with clinical observations, patient history, and epidemiological information. The expected result is Negative.  Fact Sheet for Patients:  BloggerCourse.com  Fact Sheet for Healthcare Providers:  SeriousBroker.it  This test is no t yet approved or cleared by the [place] FDA and  has been authorized for detection and/or diagnosis of SARS-CoV-2 by FDA under an Emergency Use Authorization (EUA). This EUA will  remain  in effect (meaning this test can be used) for the duration of the COVID-19 declaration under Section 564(b)(1) of the Act, 21 U.S.C.section 360bbb-3(b)(1), unless the authorization is terminated  or revoked sooner.       Influenza A by PCR NEGATIVE NEGATIVE   Influenza B by PCR NEGATIVE NEGATIVE    Comment: (NOTE) The Xpert Xpress SARS-CoV-2/FLU/RSV plus assay is intended as an aid in the diagnosis of influenza from Nasopharyngeal swab specimens and should not be used as a sole basis for treatment. Nasal washings and aspirates are unacceptable for Xpert Xpress SARS-CoV-2/FLU/RSV testing.  Fact Sheet for Patients: BloggerCourse.com  Fact Sheet for Healthcare Providers: SeriousBroker.it  This test is not yet approved or cleared by the [place] FDA and has been authorized for detection and/or diagnosis of SARS-CoV-2 by FDA under an Emergency Use Authorization (EUA). This EUA will remain in effect (meaning this test can be used) for the duration of the COVID-19 declaration under Section 564(b)(1) of the Act, 21 U.S.C. section 360bbb-3(b)(1), unless the authorization is terminated or revoked.  Performed at [facility], [address] [place], [place] [address]   Lactic acid, plasma     Status: Abnormal   Collection Time: 04/20/21  5:27 PM  Result Value Ref Range   Lactic Acid, Venous 2.5 (HH) 0.5 - 1.9 mmol/L    Comment: CRITICAL RESULT CALLED TO, READ BACK BY AND VERIFIED WITH: brenda haynes rn 1819 04/20/2021 src Performed at [facility], [address] [place], [place] [address]     CT ABDOMEN PELVIS W CONTRAST  Addendum Date: 04/20/2021   ADDENDUM REPORT: 04/20/2021 17:20 ADDENDUM: These results were called by telephone at the time of interpretation on 04/20/2021 at 5:18 pm to provider [facility] , who verbally acknowledged these results. Electronically Signed    By: Tish Frederickson M.D.   On: 04/20/2021 17:20   Result Date: 04/20/2021 CLINICAL DATA:  Abdominal pain, acute, nonlocalized severe sudden abodminal pain; concern for obstruction, volvulus EXAM: CT ABDOMEN AND PELVIS WITH CONTRAST TECHNIQUE: Multidetector CT imaging of the abdomen and pelvis was performed using the standard protocol following bolus administration of intravenous contrast. CONTRAST:  OMNIPAQUE IOHEXOL 300 MG/ML  SOLN COMPARISON:  CT abdomen pelvis 12/29/2020, CT abdomen pelvis 06/01/2016 FINDINGS: Lower chest: No acute abnormality.  Small hiatal hernia. Hepatobiliary:  No focal liver abnormality. Status post cholecystectomy. No biliary dilatation. Pancreas: No focal lesion. Normal pancreatic contour. No surrounding inflammatory changes. No main pancreatic ductal dilatation. Normal in size without focal abnormality. Spleen: Normal in size without focal abnormality. Adrenals/Urinary Tract: No adrenal nodule bilaterally. Bilateral kidneys enhance symmetrically. No hydronephrosis. No hydroureter. The urinary bladder is unremarkable. Stomach/Bowel: Status post Roux-en-Y gastric bypass. Two regions of persistent swirling of the small bowel mesentery is noted (2:35, 50). Associated mesenteric edema. Stomach is within normal limits. Poor enhancement of the wall of the small bowel associated with the mid abdominal and jejunojejunostomy (5:85, 2:33). No definite pneumatosis. No evidence of bowel wall thickening or dilatation. Appendix appears normal. Vascular/Lymphatic: No abdominal aorta or iliac aneurysm. No abdominal, pelvic, or inguinal lymphadenopathy. Reproductive: Prostate is unremarkable. Other: No intraperitoneal free fluid. No intraperitoneal free gas. No organized fluid collection. Musculoskeletal: No abdominal wall hernia or abnormality. No suspicious lytic or blastic osseous lesions. No acute displaced fracture. IMPRESSION: 1. Concern for mesenteric volvulus and small bowel ischemia in the  setting of persistent mesenteric swirling, increased mesenteric edema, and interval development of non-enhancement of the small bowel associated with the mid abdominal jejunojejunostomy. No definite pneumatosis. No associated bowel obstruction or perforation. Recommend correlation with lactate levels and surgical consultation. 2. Status post Roux-en-Y gastric bypass. Electronically Signed: By: Tish Frederickson M.D. On: 04/20/2021 17:16    Imaging: Personally reviewed  A/P: Robert Hess is an 40 y.o. male with history of laparoscopic Roux-en-Y gastric bypass at [facility] in November 2016 Abdominal pain with peritonitis Concerns for mesenteric volvulus and small bowel ischemia based on CT imaging as well as elevated lactate Nausea vomiting Hypokalemia  Fortunately he is hemodynamically stable.  No fever or hypotension.  Normal white count.  He needs to go to the operating room urgently for exploration.  I think we can start laparoscopically but we did discuss the possible conversion to open for exploratory laparotomy.  We did discuss the possibility of bowel resection with an inherent risk of anastomotic stricture or leak.  We discussed the potential of incisional hernia or wound infection if we had to convert to open.  We discussed the risk of bleeding, infection, injury to surrounding structures, perioperative cardiac and pulmonary events, aspiration, blood clot formation.  We discussed the potential for additional procedure  [facility] Aggressive pain control High risk for aspiration on induction-discussed with anesthesia Antiemetic  If there is no sign of frank ischemia and just torsion of his bowel that we can detorse I am not sure if I will plan to do a major anastomotic revision this evening since his gastric bypass anatomy/configuration was felt to be somewhat atypical during his previous laparoscopy.  I think that would be better  addressed during elective situation if it can be helped  Called and spoke with the wife.  Told her my surgical plan and potential findings and potential plans.  She voiced understand  Mary Sella. Andrey Campanile, MD, FACS General, Bariatric, & Minimally Invasive Surgery [facility] Surgery, [place]

## 2021-04-20 NOTE — ED Notes (Signed)
Report called to [place] with [facility]. 

## 2021-04-20 NOTE — Anesthesia Procedure Notes (Signed)
Procedure Name: Intubation Date/Time: 04/20/2021 8:48 PM Performed by: Raenette Rover, CRNA Pre-anesthesia Checklist: Patient identified, Emergency Drugs available, Suction available and Patient being monitored Patient Re-evaluated:Patient Re-evaluated prior to induction Oxygen Delivery Method: Circle system utilized Preoxygenation: Pre-oxygenation with 100% oxygen Induction Type: IV induction, Rapid sequence and Cricoid Pressure applied Ventilation: Mask ventilation without difficulty Laryngoscope Size: Mac and 4 Grade View: Grade I Tube type: Oral Tube size: 7.5 mm Number of attempts: 1 Airway Equipment and Method: Stylet Placement Confirmation: ETT inserted through vocal cords under direct vision, positive ETCO2 and breath sounds checked- equal and bilateral Secured at: 23 cm Tube secured with: Tape Dental Injury: Teeth and Oropharynx as per pre-operative assessment

## 2021-04-20 NOTE — ED Notes (Signed)
Report called to Aundra Millet, Consulting civil engineer at [facility] ED.

## 2021-04-21 ENCOUNTER — Inpatient Hospital Stay (HOSPITAL_COMMUNITY): Payer: 59

## 2021-04-21 ENCOUNTER — Encounter (HOSPITAL_COMMUNITY): Payer: Self-pay | Admitting: General Surgery

## 2021-04-21 DIAGNOSIS — R000 Tachycardia, unspecified: Secondary | ICD-10-CM

## 2021-04-21 LAB — CBC
HCT: 51.7 % (ref 39.0–52.0)
HCT: 51.6 % (ref 39.0–52.0)
HCT: 51.7 % (ref 39.0–52.0)
Hemoglobin: 17.2 g/dL — ABNORMAL HIGH (ref 13.0–17.0)
Hemoglobin: 17.4 g/dL — ABNORMAL HIGH (ref 13.0–17.0)
Hemoglobin: 17.3 g/dL — ABNORMAL HIGH (ref 13.0–17.0)
MCH: 30 pg (ref 26.0–34.0)
MCH: 29.7 pg (ref 26.0–34.0)
MCH: 29.9 pg (ref 26.0–34.0)
MCHC: 33.3 g/dL (ref 30.0–36.0)
MCHC: 33.7 g/dL (ref 30.0–36.0)
MCHC: 33.5 g/dL (ref 30.0–36.0)
MCV: 90.2 fL (ref 80.0–100.0)
MCV: 88.2 fL (ref 80.0–100.0)
MCV: 89.3 fL (ref 80.0–100.0)
Platelets: 309 10*3/uL (ref 150–400)
Platelets: 326 10*3/uL (ref 150–400)
Platelets: 273 10*3/uL (ref 150–400)
RBC: 5.73 MIL/uL (ref 4.22–5.81)
RBC: 5.85 MIL/uL — ABNORMAL HIGH (ref 4.22–5.81)
RBC: 5.79 MIL/uL (ref 4.22–5.81)
RDW: 14 % (ref 11.5–15.5)
RDW: 13.9 % (ref 11.5–15.5)
RDW: 14.2 % (ref 11.5–15.5)
WBC: 17.8 10*3/uL — ABNORMAL HIGH (ref 4.0–10.5)
WBC: 14.7 10*3/uL — ABNORMAL HIGH (ref 4.0–10.5)
WBC: 11.3 10*3/uL — ABNORMAL HIGH (ref 4.0–10.5)
nRBC: 0 % (ref 0.0–0.2)
nRBC: 0 % (ref 0.0–0.2)
nRBC: 0 % (ref 0.0–0.2)

## 2021-04-21 LAB — GLUCOSE, CAPILLARY
Glucose-Capillary: 137 mg/dL — ABNORMAL HIGH (ref 70–99)
Glucose-Capillary: 104 mg/dL — ABNORMAL HIGH (ref 70–99)

## 2021-04-21 LAB — MAGNESIUM: Magnesium: 1.5 mg/dL — ABNORMAL LOW (ref 1.7–2.4)

## 2021-04-21 LAB — BASIC METABOLIC PANEL
Anion gap: 11 (ref 5–15)
BUN: 17 mg/dL (ref 6–20)
CO2: 21 mmol/L — ABNORMAL LOW (ref 22–32)
Calcium: 8.6 mg/dL — ABNORMAL LOW (ref 8.9–10.3)
Chloride: 103 mmol/L (ref 98–111)
Creatinine, Ser: 0.89 mg/dL (ref 0.61–1.24)
GFR, Estimated: >60 mL/min (ref >60)
Glucose, Bld: 143 mg/dL — ABNORMAL HIGH (ref 70–99)
Potassium: 3.8 mmol/L (ref 3.5–5.1)
Sodium: 135 mmol/L (ref 135–145)

## 2021-04-21 LAB — TROPONIN I (HIGH SENSITIVITY): Troponin I (High Sensitivity): <2 ng/L (ref <18)

## 2021-04-21 LAB — HEMOGLOBIN A1C
Hgb A1c MFr Bld: 5.3 % (ref 4.8–5.6)
Mean Plasma Glucose: 105.41 mg/dL

## 2021-04-21 MED ORDER — FENTANYL CITRATE PF 50 MCG/ML IJ SOSY
PREFILLED_SYRINGE | INTRAMUSCULAR | Status: AC
  Administered 2021-04-21: 50 ug via INTRAVENOUS
  Filled 2021-04-21: qty 1

## 2021-04-21 MED ORDER — ONDANSETRON 4 MG PO TBDP: 4.0000 mg | ORAL_TABLET | Freq: Four times a day (QID) | ORAL | Status: DC | PRN

## 2021-04-21 MED ORDER — INSULIN ASPART 100 UNIT/ML IJ SOLN
0.0000 [IU] | INTRAMUSCULAR | Status: DC
  Administered 2021-04-21: 3 [IU] via SUBCUTANEOUS

## 2021-04-21 MED ORDER — SODIUM CHLORIDE 0.9 % IV BOLUS
500.0000 mL | Freq: Once | INTRAVENOUS | Status: AC
  Administered 2021-04-21: 500 mL via INTRAVENOUS

## 2021-04-21 MED ORDER — DIPHENHYDRAMINE HCL 50 MG/ML IJ SOLN
12.5000 mg | Freq: Four times a day (QID) | INTRAMUSCULAR | Status: DC | PRN
  Filled 2021-04-21: qty 1

## 2021-04-21 MED ORDER — DEXTROSE 5 % IV SOLN
1000.0000 mg | Freq: Four times a day (QID) | INTRAVENOUS | Status: DC | PRN
  Filled 2021-04-21: qty 10

## 2021-04-21 MED ORDER — METHOCARBAMOL 500 MG IVPB - SIMPLE MED
500.0000 mg | Freq: Three times a day (TID) | INTRAVENOUS | Status: DC | PRN
  Filled 2021-04-21: qty 50

## 2021-04-21 MED ORDER — IOHEXOL 350 MG/ML SOLN
80.0000 mL | Freq: Once | INTRAVENOUS | Status: AC | PRN
  Administered 2021-04-21: 80 mL via INTRAVENOUS

## 2021-04-21 MED ORDER — POTASSIUM CHLORIDE IN NACL 20-0.9 MEQ/L-% IV SOLN
INTRAVENOUS | Status: DC
  Filled 2021-04-21 (×6): qty 1000

## 2021-04-21 MED ORDER — HYDROMORPHONE HCL 1 MG/ML IJ SOLN
1.0000 mg | INTRAMUSCULAR | Status: DC | PRN
  Administered 2021-04-21 – 2021-04-23 (×15): 1 mg via INTRAVENOUS
  Filled 2021-04-21 (×16): qty 1

## 2021-04-21 MED ORDER — MAGIC MOUTHWASH
15.0000 mL | Freq: Four times a day (QID) | ORAL | Status: DC | PRN
  Filled 2021-04-21: qty 15

## 2021-04-21 MED ORDER — GABAPENTIN 300 MG PO CAPS: 600.0000 mg | ORAL_CAPSULE | Freq: Three times a day (TID) | ORAL | Status: DC

## 2021-04-21 MED ORDER — MORPHINE SULFATE (PF) 4 MG/ML IV SOLN
1.0000 mg | INTRAVENOUS | Status: DC | PRN
  Administered 2021-04-21 (×2): 2 mg via INTRAVENOUS
  Filled 2021-04-21 (×2): qty 1

## 2021-04-21 MED ORDER — METOCLOPRAMIDE HCL 5 MG/ML IJ SOLN: 5.0000 mg | Freq: Four times a day (QID) | INTRAMUSCULAR | Status: DC | PRN

## 2021-04-21 MED ORDER — AMISULPRIDE (ANTIEMETIC) 5 MG/2ML IV SOLN
10.0000 mg | Freq: Once | INTRAVENOUS | Status: AC
  Administered 2021-04-21: 10 mg via INTRAVENOUS

## 2021-04-21 MED ORDER — FENTANYL CITRATE PF 50 MCG/ML IJ SOSY
PREFILLED_SYRINGE | INTRAMUSCULAR | Status: AC
  Filled 2021-04-21: qty 1

## 2021-04-21 MED ORDER — HYDROMORPHONE HCL 1 MG/ML IJ SOLN
1.0000 mg | INTRAMUSCULAR | Status: DC | PRN
  Administered 2021-04-21: 1 mg via INTRAVENOUS
  Filled 2021-04-21: qty 1

## 2021-04-21 MED ORDER — PANTOPRAZOLE SODIUM 40 MG IV SOLR: 40.0000 mg | Freq: Every day | INTRAVENOUS | Status: DC

## 2021-04-21 MED ORDER — THIAMINE HCL 100 MG/ML IJ SOLN
100.0000 mg | Freq: Every day | INTRAMUSCULAR | Status: DC
  Administered 2021-04-21 – 2021-04-24 (×4): 100 mg via INTRAVENOUS
  Filled 2021-04-21 (×3): qty 2

## 2021-04-21 MED ORDER — BISACODYL 10 MG RE SUPP
10.0000 mg | Freq: Two times a day (BID) | RECTAL | Status: DC | PRN
Start: 2021-04-21 — End: 2021-04-24

## 2021-04-21 MED ORDER — METHOCARBAMOL 1000 MG/10ML IJ SOLN
500.0000 mg | Freq: Three times a day (TID) | INTRAVENOUS | Status: DC
  Administered 2021-04-21: 500 mg via INTRAVENOUS
  Filled 2021-04-21: qty 5
  Filled 2021-04-21: qty 500

## 2021-04-21 MED ORDER — LACTATED RINGERS IV BOLUS
1000.0000 mL | Freq: Three times a day (TID) | INTRAVENOUS | Status: DC | PRN
  Administered 2021-04-21: 1000 mL via INTRAVENOUS

## 2021-04-21 MED ORDER — LIP MEDEX EX OINT
1.0000 "application " | TOPICAL_OINTMENT | Freq: Two times a day (BID) | CUTANEOUS | Status: DC
  Administered 2021-04-21 – 2021-04-24 (×5): 1 via TOPICAL
  Filled 2021-04-21 (×2): qty 7

## 2021-04-21 MED ORDER — PANTOPRAZOLE SODIUM 40 MG IV SOLR
40.0000 mg | Freq: Two times a day (BID) | INTRAVENOUS | Status: DC
  Administered 2021-04-21 – 2021-04-22 (×5): 40 mg via INTRAVENOUS
  Filled 2021-04-21 (×6): qty 40

## 2021-04-21 MED ORDER — ADULT MULTIVITAMIN W/MINERALS CH: 1.0000 | ORAL_TABLET | Freq: Every day | ORAL | Status: DC

## 2021-04-21 MED ORDER — ENOXAPARIN SODIUM 40 MG/0.4ML IJ SOSY
40.0000 mg | PREFILLED_SYRINGE | INTRAMUSCULAR | Status: DC
  Administered 2021-04-21 – 2021-04-24 (×4): 40 mg via SUBCUTANEOUS
  Filled 2021-04-21 (×4): qty 0.4

## 2021-04-21 MED ORDER — SODIUM CHLORIDE 0.9 % IV BOLUS: 1000.0000 mL | Freq: Once | INTRAVENOUS | Status: DC

## 2021-04-21 MED ORDER — MAGNESIUM SULFATE 2 GM/50ML IV SOLN
2.0000 g | Freq: Once | INTRAVENOUS | Status: AC
  Administered 2021-04-21: 2 g via INTRAVENOUS
  Filled 2021-04-21: qty 50

## 2021-04-21 MED ORDER — SODIUM CHLORIDE 0.9 % IV SOLN
12.5000 mg | Freq: Four times a day (QID) | INTRAVENOUS | Status: DC | PRN
  Administered 2021-04-21: 12.5 mg via INTRAVENOUS
  Filled 2021-04-21: qty 0.5
  Filled 2021-04-21: qty 12.5

## 2021-04-21 MED ORDER — SIMETHICONE 80 MG PO CHEW: 40.0000 mg | CHEWABLE_TABLET | Freq: Four times a day (QID) | ORAL | Status: DC | PRN

## 2021-04-21 MED ORDER — ONDANSETRON HCL 4 MG/2ML IJ SOLN
4.0000 mg | Freq: Four times a day (QID) | INTRAMUSCULAR | Status: DC | PRN
  Filled 2021-04-21: qty 2

## 2021-04-21 MED ORDER — THIAMINE HCL 100 MG PO TABS: 100.0000 mg | ORAL_TABLET | Freq: Every day | ORAL | Status: DC

## 2021-04-21 MED ORDER — AMISULPRIDE (ANTIEMETIC) 5 MG/2ML IV SOLN
INTRAVENOUS | Status: AC
  Filled 2021-04-21: qty 4

## 2021-04-21 MED ORDER — ACETAMINOPHEN 10 MG/ML IV SOLN
1000.0000 mg | Freq: Four times a day (QID) | INTRAVENOUS | Status: AC
  Administered 2021-04-21 (×4): 1000 mg via INTRAVENOUS
  Filled 2021-04-21 (×4): qty 100

## 2021-04-21 MED ORDER — ACETAMINOPHEN 650 MG RE SUPP: 650.0000 mg | Freq: Four times a day (QID) | RECTAL | Status: DC | PRN

## 2021-04-21 MED ORDER — ACETAMINOPHEN 500 MG PO TABS: 1000.0000 mg | ORAL_TABLET | Freq: Three times a day (TID) | ORAL | Status: DC

## 2021-04-21 MED ORDER — DIPHENHYDRAMINE HCL 12.5 MG/5ML PO ELIX: 12.5000 mg | ORAL_SOLUTION | Freq: Four times a day (QID) | ORAL | Status: DC | PRN

## 2021-04-21 NOTE — Progress Notes (Signed)
I was given report that the patient was vomiting bright red blood at PACU and he was still vomiting when he got to the floor. A & O x 4. Protonix IV push administered and I have not observed any more emesis since then. He's resting quietly at this time with his eyes closed. Respirations even and unlabored. Admission profile not completed and will be done when he wakes up. Will continue to monitor.

## 2021-04-21 NOTE — Progress Notes (Signed)
Pt still having emesis after zofran and phenergan.  Emesis appears green with streaks of blood. Pt c/o of pain 7/10.  Administering dilaudid every 2 hours for pain control.  Pt looks pale, BP 94/70 , HR 126 ST.  Notified NP and executed new orders.  Pt still having emesis and pain.  Pt is eating ice chips for dry mouth.  Wife at [facility].

## 2021-04-21 NOTE — Progress Notes (Addendum)
Patient ID: Robert Hess, male   DOB: 05/25/1981, 40 y.o.   MRN: [id number] [facility] Surgery Progress Note  1 Day Post-Op  Subjective: CC-  Abdomen sore, pain much improved from prior to surgery. Pain is worse with deep inspiration. Continues to complain of n/v. Has not been given zofran since midnight. No flatus or BM. Also complains of constant pain in his chest. Denies SOB.  Objective: Vital signs in last 24 hours: Temp:  [97.8 F (36.6 C)-98.3 F (36.8 C)] 98.2 F (36.8 C) (10/14 0802) Pulse Rate:  [65-126] 126 (10/14 0802) Resp:  [10-32] 18 (10/14 0802) BP: (106-160)/(65-90) 109/79 (10/14 0802) SpO2:  [94 %-100 %] 95 % (10/14 0802) Weight:  [121.4 kg] 121.4 kg (10/13 1617) Last BM Date: 04/20/21  Intake/Output from previous day: 10/13 0701 - 10/14 0700 In: 4044.4 [I.V.:2944.4; IV Piggyback:1100] Out: 1520 [Urine:1400; Emesis/NG output:100; Blood:20] Intake/Output this shift: No intake/output data recorded.  PE: Gen:  Alert, NAD, pleasant Card:  tachycardic 120s Pulm:  CTAB, no W/R/R, rate and effort normal Abd: Soft, ND, mild diffuse TTP without rebound or guarding, hypoactive BS, midline incision with honeycomb dressing in place Psych: A&Ox3 Skin: no rashes noted, warm and dry  Lab Results:  Recent Labs    04/20/21 1527 04/21/21 0457  WBC 7.7 17.8*  HGB 14.3 17.2*  HCT 42.5 51.7  PLT 266 309   BMET Recent Labs    04/20/21 1527 04/21/21 0457  NA 139 135  K 3.3* 3.8  CL 106 103  CO2 22 21*  GLUCOSE 121* 143*  BUN 14 17  CREATININE 0.75 0.89  CALCIUM 9.6 8.6*   PT/INR No results for input(s): LABPROT, INR in the last 72 hours. CMP     Component Value Date/Time   NA 135 04/21/2021 0457   NA 142 11/12/2018 0953   K 3.8 04/21/2021 0457   CL 103 04/21/2021 0457   CO2 21 (L) 04/21/2021 0457   GLUCOSE 143 (H) 04/21/2021 0457   BUN 17 04/21/2021 0457   BUN 13 11/12/2018 0953   CREATININE 0.89 04/21/2021 0457   CALCIUM 8.6 (L) 04/21/2021  0457   PROT 6.8 04/20/2021 1527   PROT 7.0 11/12/2018 0953   ALBUMIN 4.4 04/20/2021 1527   ALBUMIN 4.5 11/12/2018 0953   AST 15 04/20/2021 1527   ALT 10 04/20/2021 1527   ALKPHOS 68 04/20/2021 1527   BILITOT 0.6 04/20/2021 1527   BILITOT 0.4 11/12/2018 0953   GFRNONAA >60 04/21/2021 0457   GFRAA >60 04/11/2019 0928   Lipase     Component Value Date/Time   LIPASE 44 04/20/2021 1527       Studies/Results: CT ABDOMEN PELVIS W CONTRAST  Addendum Date: 04/20/2021   ADDENDUM REPORT: 04/20/2021 17:20 ADDENDUM: These results were called by telephone at the time of interpretation on 04/20/2021 at 5:18 pm to provider [facility] , who verbally acknowledged these results. Electronically Signed   By: Tish Frederickson M.D.   On: 04/20/2021 17:20   Result Date: 04/20/2021 CLINICAL DATA:  Abdominal pain, acute, nonlocalized severe sudden abodminal pain; concern for obstruction, volvulus EXAM: CT ABDOMEN AND PELVIS WITH CONTRAST TECHNIQUE: Multidetector CT imaging of the abdomen and pelvis was performed using the standard protocol following bolus administration of intravenous contrast. CONTRAST:  OMNIPAQUE IOHEXOL 300 MG/ML  SOLN COMPARISON:  CT abdomen pelvis 12/29/2020, CT abdomen pelvis 06/01/2016 FINDINGS: Lower chest: No acute abnormality.  Small hiatal hernia. Hepatobiliary: No focal liver abnormality. Status post cholecystectomy. No biliary dilatation.  Pancreas: No focal lesion. Normal pancreatic contour. No surrounding inflammatory changes. No main pancreatic ductal dilatation. Normal in size without focal abnormality. Spleen: Normal in size without focal abnormality. Adrenals/Urinary Tract: No adrenal nodule bilaterally. Bilateral kidneys enhance symmetrically. No hydronephrosis. No hydroureter. The urinary bladder is unremarkable. Stomach/Bowel: Status post Roux-en-Y gastric bypass. Two regions of persistent swirling of the small bowel mesentery is noted (2:35, 50). Associated  mesenteric edema. Stomach is within normal limits. Poor enhancement of the wall of the small bowel associated with the mid abdominal and jejunojejunostomy (5:85, 2:33). No definite pneumatosis. No evidence of bowel wall thickening or dilatation. Appendix appears normal. Vascular/Lymphatic: No abdominal aorta or iliac aneurysm. No abdominal, pelvic, or inguinal lymphadenopathy. Reproductive: Prostate is unremarkable. Other: No intraperitoneal free fluid. No intraperitoneal free gas. No organized fluid collection. Musculoskeletal: No abdominal wall hernia or abnormality. No suspicious lytic or blastic osseous lesions. No acute displaced fracture. IMPRESSION: 1. Concern for mesenteric volvulus and small bowel ischemia in the setting of persistent mesenteric swirling, increased mesenteric edema, and interval development of non-enhancement of the small bowel associated with the mid abdominal jejunojejunostomy. No definite pneumatosis. No associated bowel obstruction or perforation. Recommend correlation with lactate levels and surgical consultation. 2. Status post Roux-en-Y gastric bypass. Electronically Signed: By: Tish Frederickson M.D. 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 Andrey Campanile - Use zofran that is ordered for nausea - continue ice chips only for now until nausea improves - mobilize - Tachycardic with chest pain - check EKG and troponins  - continue IV tylenol,  schedule IV robaxin, and change morphine to dilaudid - will need to order home meds when taking PO  ID - zosyn x1 FEN - IVF, ice chips VTE - lovenox Foley - none    LOS: 1 day    Franne Forts, [facility] Surgery 04/21/2021, 9:07 AM Please see Amion for pager number during day hours 7:00am-4:30pm

## 2021-04-21 NOTE — Progress Notes (Signed)
[facility] rm 1621, Robert Hess 40, Result of CT Scan at 2005. Radiology wanted you to look at result impression ASAP. Thanks.

## 2021-04-21 NOTE — Consult Note (Signed)
Cardiology Consultation:   Patient ID: Robert Hess MRN: [id number]; DOB: [birth year]  Admit date: 04/20/2021 Date of Consult: 04/21/2021  PCP:  Rodrigo Ran, MD   [facility] Providers Cardiologist:  None        Patient Profile:   Robert Hess is a 40 y.o. male with a hx of of obesity status post gastric bypass and back in June 2022 underwent diagnostic laparoscopy and detorsion of mesenteric volvulus presented to be evaluated for abdominal pain and now status post exploratory laparotomy with reduction of small bowel volvulus who is being seen 04/21/2021 for the evaluation of abnormal EKG and sinus tachycardia at the request of Brooke A Meuht,PA.  History of Present Illness:   Robert Hess obesity of laparoscopic adjustable gastric band placement which was removed in 2016 followed a few months later in November 2016 by laparoscopic Roux-en-Y antecolic antigastric gastric bypass at [facility] by Dr. Lily Peer, back in June 2022 underwent diagnostic laparoscopy and detorsion of mesenteric volvulus presented to be evaluated for abdominal pain and now status post exploratory laparotomy with reduction of small bowel volvulus. He also reported family history of coronary artery disease with his father first MI being at the age of 48.  He denies hyperlipidemia or hypertension.  Since his surgery the patient tells me that he has been experiencing significant pain, he has had admitted nausea and vomiting and is unable to take deep breaths.  For me he denies any chest pain.  But tells me there is some fullness when he tries to take deeper breaths than he has been able to.  Cardiology has been consulted to assess the patient for sinus tachycardia and changes on EKG.  Patient was seen examined by me at his bedside.  No chest pain currently.  Past Medical History:  Diagnosis Date   Back pain     Past Surgical History:  Procedure Laterality Date   ABDOMINAL SURGERY     GASTRIC  BYPASS     HERNIA REPAIR     LAPAROSCOPIC GASTRIC BANDING     LAPAROSCOPIC REMOVAL OF MESENTERIC MASS N/A 12/29/2020   Procedure: Diagnostic laparoscopy, detorsion of mesenteric volvulus;  Surgeon: Berna Bue, MD;  Location: [facility] OR;  Service: General;  Laterality: N/A;   LAPAROSCOPY  04/20/2021   Procedure: DIAGNOSTIC LAPAROSCOPY, EXPLORATORY LAPAROTOMY, REDUCTION OF SMALL BOWEL VOLVULUS;  Surgeon: Gaynelle Adu, MD;  Location: [facility] ORS;  Service: General;;       Inpatient Medications: Scheduled Meds:  enoxaparin (LOVENOX) injection  40 mg Subcutaneous Q24H   lip balm  1 application Topical BID   pantoprazole (PROTONIX) IV  40 mg Intravenous Q12H   thiamine injection  100 mg Intravenous Daily   Continuous Infusions:  0.9 % NaCl with KCl 20 mEq / L 100 mL/hr at 04/21/21 0313   acetaminophen 1,000 mg (04/21/21 1547)   lactated ringers     methocarbamol (ROBAXIN) IV     methocarbamol (ROBAXIN) IV 500 mg (04/21/21 1359)   promethazine (PHENERGAN) injection (IM or IVPB) 12.5 mg (04/21/21 1143)   PRN Meds: bisacodyl, diphenhydrAMINE **OR** diphenhydrAMINE, HYDROmorphone (DILAUDID) injection, lactated ringers, magic mouthwash, methocarbamol (ROBAXIN) IV, metoCLOPramide (REGLAN) injection, ondansetron **OR** ondansetron (ZOFRAN) IV, promethazine (PHENERGAN) injection (IM or IVPB), simethicone  Allergies:    Allergies  Allergen Reactions   Other Hives    *Rose Hair on a spider* (tarantula hair)   Nsaids     Had gastric bypass and unable to take.   Oxycodone Nausea Only  Social History:   Social History   Socioeconomic History   Marital status: Married    Spouse name: Not on file   Number of children: Not on file   Years of education: Not on file   Highest education level: Not on file  Occupational History   Not on file  Tobacco Use   Smoking status: Former   Smokeless tobacco: Never  Vaping Use   Vaping Use: Never used  Substance and Sexual Activity   Alcohol use:  Not Currently    Comment: occ   Drug use: No   Sexual activity: Not on file  Other Topics Concern   Not on file  Social History Narrative   Not on file   Social Determinants of Health   Financial Resource Strain: Not on file  Food Insecurity: Not on file  Transportation Needs: Not on file  Physical Activity: Not on file  Stress: Not on file  Social Connections: Not on file  Intimate Partner Violence: Not on file    Family History:   The patient reported his father first MI was at the age of 41.    ROS:  Please see the history of present illness.   Review of Systems  Constitution: Negative for decreased appetite, fever and weight gain.  HENT: Negative for congestion, ear discharge, hoarse voice and sore throat.   Eyes: Negative for discharge, redness, vision loss in right eye and visual halos.  Cardiovascular: Negative for chest pain, dyspnea on exertion, leg swelling, orthopnea and palpitations.  Respiratory: Negative for cough, hemoptysis, shortness of breath and snoring.   Endocrine: Negative for heat intolerance and polyphagia.  Hematologic/Lymphatic: Negative for bleeding problem. Does not bruise/bleed easily.  Skin: Negative for flushing, nail changes, rash and suspicious lesions.  Musculoskeletal: Negative for arthritis, joint pain, muscle cramps, myalgias, neck pain and stiffness.  Gastrointestinal: Reports abdominal pain, bowel incontinence, diarrhea and excessive appetite.  Genitourinary: Negative for decreased libido, genital sores and incomplete emptying.  Neurological: Negative for brief paralysis, focal weakness, headaches and loss of balance.  Psychiatric/Behavioral: Negative for altered mental status, depression and suicidal ideas.  Allergic/Immunologic: Negative for HIV exposure and persistent infections.    Physical Exam/Data:   Vitals:   04/21/21 1344 04/21/21 1434 04/21/21 1549 04/21/21 1658  BP: 93/71 108/61 (!) 84/62 (!) 83/58  Pulse: (!) 131 (!)  138 (!) 130 (!) 136  Resp: 16 16 16 16   Temp: 97.7 F (36.5 C) 97.8 F (36.6 C) 98 F (36.7 C) 98 F (36.7 C)  TempSrc: Oral Oral Oral Oral  SpO2: 95% 95% 93% 92%  Weight:      Height:        Intake/Output Summary (Last 24 hours) at 04/21/2021 1731 Last data filed at 04/21/2021 1436 Gross per 24 hour  Intake 4244.4 ml  Output 1670 ml  Net 2574.4 ml   Last 3 Weights 04/20/2021 12/30/2020 12/29/2020  Weight (lbs) 267 lb 9.6 oz 284 lb 14.4 oz 280 lb  Weight (kg) 121.383 kg 129.23 kg 127.007 kg     Body mass index is 34.36 kg/m.  General:  Well nourished, well developed, in no acute distress HEENT: normal Neck: no JVD Vascular: No carotid bruits; Distal pulses 2+ bilaterally Cardiac:  normal S1, S2; RRR; no murmur  Lungs:  clear to auscultation bilaterally, no wheezing, rhonchi or rales  Abd: soft, nontender, no hepatomegaly  Ext: no edema Musculoskeletal:  No deformities, BUE and BLE strength normal and equal Skin: warm and dry  Neuro:  CNs 2-12 intact, no focal abnormalities noted Psych:  Normal affect   EKG:  The EKG was personally reviewed and demonstrates: Sinus tachycardia, heart rate 127 bpm with non specific t wave inversion in lead III, compared to EKGs that was done in October 2020 as well as June 2022 this nonspecific T wave is present.  Telemetry:  Telemetry was personally reviewed and demonstrates: Sinus tachycardia.  Relevant CV Studies: None   Laboratory Data:  High Sensitivity Troponin:   Recent Labs  Lab 04/21/21 0946  TROPONINIHS <2     Chemistry Recent Labs  Lab 04/20/21 1527 04/21/21 0457  NA 139 135  K 3.3* 3.8  CL 106 103  CO2 22 21*  GLUCOSE 121* 143*  BUN 14 17  CREATININE 0.75 0.89  CALCIUM 9.6 8.6*  MG  --  1.5*  GFRNONAA >60 >60  ANIONGAP 11 11    Recent Labs  Lab 04/20/21 1527  PROT 6.8  ALBUMIN 4.4  AST 15  ALT 10  ALKPHOS 68  BILITOT 0.6   Lipids No results for input(s): CHOL, TRIG, HDL, LABVLDL, LDLCALC, CHOLHDL  in the last 168 hours.  Hematology Recent Labs  Lab 04/21/21 0457 04/21/21 0946 04/21/21 1441  WBC 17.8* 14.7* 11.3*  RBC 5.73 5.85* 5.79  HGB 17.2* 17.4* 17.3*  HCT 51.7 51.6 51.7  MCV 90.2 88.2 89.3  MCH 30.0 29.7 29.9  MCHC 33.3 33.7 33.5  RDW 14.0 13.9 14.2  PLT 309 326 273   Thyroid No results for input(s): TSH, FREET4 in the last 168 hours.  BNPNo results for input(s): BNP, PROBNP in the last 168 hours.  DDimer No results for input(s): DDIMER in the last 168 hours.   Radiology/Studies:  CT ABDOMEN PELVIS W CONTRAST  Addendum Date: 04/20/2021   ADDENDUM REPORT: 04/20/2021 17:20 ADDENDUM: These results were called by telephone at the time of interpretation on 04/20/2021 at 5:18 pm to provider [facility] , who verbally acknowledged these results. Electronically Signed   By: Tish Frederickson M.D.   On: 04/20/2021 17:20   Result Date: 04/20/2021 CLINICAL DATA:  Abdominal pain, acute, nonlocalized severe sudden abodminal pain; concern for obstruction, volvulus EXAM: CT ABDOMEN AND PELVIS WITH CONTRAST TECHNIQUE: Multidetector CT imaging of the abdomen and pelvis was performed using the standard protocol following bolus administration of intravenous contrast. CONTRAST:  OMNIPAQUE IOHEXOL 300 MG/ML  SOLN COMPARISON:  CT abdomen pelvis 12/29/2020, CT abdomen pelvis 06/01/2016 FINDINGS: Lower chest: No acute abnormality.  Small hiatal hernia. Hepatobiliary: No focal liver abnormality. Status post cholecystectomy. No biliary dilatation. Pancreas: No focal lesion. Normal pancreatic contour. No surrounding inflammatory changes. No main pancreatic ductal dilatation. Normal in size without focal abnormality. Spleen: Normal in size without focal abnormality. Adrenals/Urinary Tract: No adrenal nodule bilaterally. Bilateral kidneys enhance symmetrically. No hydronephrosis. No hydroureter. The urinary bladder is unremarkable. Stomach/Bowel: Status post Roux-en-Y gastric bypass. Two regions  of persistent swirling of the small bowel mesentery is noted (2:35, 50). Associated mesenteric edema. Stomach is within normal limits. Poor enhancement of the wall of the small bowel associated with the mid abdominal and jejunojejunostomy (5:85, 2:33). No definite pneumatosis. No evidence of bowel wall thickening or dilatation. Appendix appears normal. Vascular/Lymphatic: No abdominal aorta or iliac aneurysm. No abdominal, pelvic, or inguinal lymphadenopathy. Reproductive: Prostate is unremarkable. Other: No intraperitoneal free fluid. No intraperitoneal free gas. No organized fluid collection. Musculoskeletal: No abdominal wall hernia or abnormality. No suspicious lytic or blastic osseous lesions. No acute displaced fracture. IMPRESSION:  1. Concern for mesenteric volvulus and small bowel ischemia in the setting of persistent mesenteric swirling, increased mesenteric edema, and interval development of non-enhancement of the small bowel associated with the mid abdominal jejunojejunostomy. No definite pneumatosis. No associated bowel obstruction or perforation. Recommend correlation with lactate levels and surgical consultation. 2. Status post Roux-en-Y gastric bypass. Electronically Signed: By: Tish Frederickson M.D. On: 04/20/2021 17:16     Assessment and Plan:   Sinus tachycardia Hypotension in the setting of hypovolemia and pain medication use  Post operative Day 1 Electrolytes abnormalities  His EKG and telemetry show sinus tachycardia which is in the setting of suspected hypovolemia and his pain.  The T wave inversion is nonspecific and not new and does not correlate with any anginal symptoms.  No need for any further ischemic work-up in this patient at this time.    Treating sinus tachycardia is significantly based on has treating underlying condition which in this patient is his pain and his volume depletion. No need for any rate control agents at this time.  In terms of his low blood pressure  suspect that his volume depletion as well as the use of opioid analgesics is playing a role here.  Improving his volume status will really help with his blood pressure.  If able adjusting his opioid  interval to greater than 2 hours may be helpful as well.  I am hoping that once his clinical picture improves heart rate as well as his blood pressure will also improve.   In the meantime we can get an echocardiogram to assess his LV function which I do suspect to be normal.  Please keep his potassium above 4 and his magnesium above 2.   Risk Assessment/Risk Scores:         [facility] will sign off.   Medication Recommendations:  No cardiac medication but may benefit from hydration and Incentive spirometer Other recommendations (labs, testing, etc):  Echocardiogram Follow up as an outpatient: None   For questions or updates, please contact [facility] Please consult [email or web address] for contact info under    Signed, Thomasene Ripple, DO  04/21/2021 5:31 PM

## 2021-04-21 NOTE — Op Note (Signed)
NAME: Robert Hess, Robert Hess MEDICAL RECORD NO: [id number] ACCOUNT NO: [id number] DATE OF BIRTH: August 09, 1980 FACILITY: [facility] LOCATION: [facility] PHYSICIAN: Mary Sella. Andrey Campanile, MD  Operative Report   DATE OF PROCEDURE: 04/20/2021  PREOPERATIVE DIAGNOSES: 1.  Small bowel volvulus with ischemia. 2.  History of laparoscopic Roux-en-Y gastric bypass in 05/2015 at [facility].  POSTOPERATIVE DIAGNOSES: 1.  Small bowel volvulus with resolved ischemia. 2.  History of laparoscopic Roux-en-Y gastric bypass in 05/2015 at [facility]. 3.  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  PROCEDURES: 1.  Diagnostic laparoscopy converted to exploratory laparotomy. 2.  Reduction of small bowel volvulus. 3.  Closure of Petersen's space hernia.  SURGEON:  Mary Sella. Andrey Campanile, MD  ASSISTANT SURGEON:  None.  ANESTHESIA:  General.  ESTIMATED BLOOD LOSS:  Minimal.   LOCAL:  [place].  SPECIMEN:  None.  INDICATIONS FOR PROCEDURE:  The patient is a gentleman who has a remote history of adjustable gastric band in early 2000 that was subsequently removed in the spring of 2016.  He underwent laparoscopic Roux-en-Y gastric bypass at [facility] in 05/2015.  He had presented back in the summer with abdominal pain with a CT scan concerning for small bowel volvulus.  He was taken to the operating room at that time by one of my partners and underwent diagnostic laparoscopy and reduction of  the small bowel volvulus.  He was found to have an antecolic antegastric bypass.  The mesentery of the Roux limb was oriented toward the patient's right side.  The jejunojejunostomy mesenteric closure was intact.  It was felt that the Petersen's space  could not be closed given the configuration of the BP limb and the Roux limb without revising the anastomosis, so it was left alone, and the patient was instructed to follow up at [facility].  It is unclear what happened with, whether or not the   patient followed up with [facility].  He developed intermittent postprandial abdominal pain a few days ago.  It worsened last night and acutely worsened today.  He presented to one of the medical centers and underwent labs and imaging.  He had  an elevated lactate of around 3.  CT scan demonstrated small bowel volvulus with mesenteric swirling, increased mesenteric edema concerning for bowel ischemia.  He was urgently transferred to [facility] Emergency Room.  In the emergency room, he had  peritonitis.  The patient was writhing in the bed.  We made plans to urgently take him to the operating room.  He received IV Zosyn in the emergency room.  His pain was attempted to get controlled.  I discussed the procedure with him and also called his  wife and discussed with her as well.  Please see chart for additional details.  DESCRIPTION OF PROCEDURE:  After obtaining informed consent, the patient was brought urgently to the operating room 1 at [facility].  He underwent rapid sequence intubation in order to minimize risk of aspiration.  Please see anesthesia's note  for additional details.  He was actually intubated on the stretcher.  He was then placed supine on the operating room table.  A Foley catheter was placed.  His arms were tucked at the side with appropriate padding.  Sequential compression devices were  placed.  His abdomen was prepped and draped in the usual standard surgical fashion.  A surgical timeout was performed.  Even though he had peritonitis, he was normotensive with just some mild  tachycardia.  I felt that we could start laparoscopically and see if we would be able to do the procedure minimally invasive.  A small incision was made in the left midclavicular  line just below the left subcostal margin through an old trocar site.  A 0-degree 5 mm laparoscope was advanced through the abdominal wall under direct visualization, and the abdominal cavity was carefully entered.   Pneumoperitoneum was smoothly  established without any change in the patient's vital signs.  The abdominal cavity was surveyed.  There was a fair amount of small bowel that was very dusky and blue and mildly ischemic.  There was no evidence of necrosis, however.  Two  additional 5 mm trocars were placed along the left lateral abdominal wall through previously placed trocar sites.  The patient was placed in Trendelenburg and rotated toward the left.  I initially tried to start running the bowel laparoscopically  starting at the cecum and TI and running proximally.  Within the first 15 cm of running from the TI, the mesentery of the small bowel was very edematous and heavy, and I just could not lift it up and flip it upward.  I then attempted to start proximally,  identified the Roux limb and it was antecolic.  Again, it appeared that the mesentery of the Roux limb was oriented toward the patient's right side as previously described.  There appeared to be a large amount of bowel going underneath his small bowel  pedicle.  I did not feel like I could safely continue laparoscopic and reduce it laparoscopically, so I made the decision to convert to open.  A midline incision was made.  This was done with a 10 blade.  The subcutaneous tissue was divided with  electrocautery.  The fascia was incised, and the abdominal cavity was entered.  The trocars were removed.  I initially placed a large wound protector followed by a Balfour retractor, but given the thickness of his abdominal wall, the Balfour retractor  was just not adequate exposure for me to get a good look at his abdominal viscera.  We then obtained the Bookwalter retractor and set it up.  The wound protector was still left in place.  Initially, it appeared the BP limb was on the patient's right side of the abdomen and the Roux limb was again antecolic antegastric with its mesentery pointing to the patient's right side.  I went back to the  terminal ileum  and started to run it proximally.  It was traced back proximally and it was noted to go underneath the rest of the small bowel mesentery cephalad with what appeared to be the small bowel essentially twisted on itself.  I identified the  jejunojejunostomy.  It was dilated, which is not atypical.  The mesentery was closed at the jejunojejunostomy.  There was no mesenteric defect.  I identified his Roux limb as well as his BP limb and the common channel coming off the jejunojejunostomy.   Again, his anatomy was a little bit confusing initially and I ran his bowel multiple times.  I was ultimately able to determine that he had essentially a Petersen's defect.  The Petersen's space was not closed.  I laid out the bowel in the normal roux en y gastric configuration.  Initially upon entering the abdomen open, the BP limb was on the right side as stated above, and that is what I think was confusing initially, but once I was able to reduce everything through the Petersen's space  hernia, the BP limb  returned to its normal anatomical position. I identified Petersen's space and ensured that by closing Petersen's space I would not be twisting the Roux limb onto itself.  I then closed the Petersen's space by placing 4 interrupted 2-0 silk sutures  between some transverse colon mesentery as well as epiploic appendage to the Roux limb mesentery.  I then reran the bowel three additional times looking for any signs of bowel injury and there were none.  I reconfirmed the anatomy.  Again, there was no  defect at the jejunojejunostomy.  In closing Petersen's space, I did not cause any twist on the Roux limb. There was no twisting of the BP limb.  The jejunojejunostomy laid in the left mid abdomen.  I was able to run it distally to the terminal ileum along the common channel.  I then  returned to the jejunojejunostomy and ran the BP limb up to the ligament of Treitz.  Then, I ran the Roux limb from the jejunojejunostomy up  antecolic going up towards the gastric pouch.  I was satisfied that I closed Petersen's space to prevent another  internal hernia.  The bowel was viable at this time.  There were no signs of ongoing ischemia.  The abdomen was irrigated with 1 liter of saline. Septrafilm was placed on top of the bowel.  I then closed the fascia with running looped #1 PDS, one from above and one from below.  I irrigated the  subcutaneous tissue with saline and then closed the skin with skin staples including the 3 trocar sites.  Telfa wicks were placed in the midline between some of the skin staples followed by honeycomb dressing.  All needle, instrument, and sponge counts  were correct x2.  There were no immediate complications.  I called and updated the patient's wife at the conclusion of the procedure and discussed operative findings.   [facility] D: 04/20/2021 11:44:53 pm T: 04/21/2021 12:56:00 am  JOB: [id number]/ [id number]

## 2021-04-22 ENCOUNTER — Inpatient Hospital Stay (HOSPITAL_COMMUNITY): Payer: 59

## 2021-04-22 DIAGNOSIS — K56609 Unspecified intestinal obstruction, unspecified as to partial versus complete obstruction: Secondary | ICD-10-CM

## 2021-04-22 LAB — CBC
HCT: 41.8 % (ref 39.0–52.0)
Hemoglobin: 13.9 g/dL (ref 13.0–17.0)
MCH: 29.9 pg (ref 26.0–34.0)
MCHC: 33.3 g/dL (ref 30.0–36.0)
MCV: 89.9 fL (ref 80.0–100.0)
Platelets: 211 10*3/uL (ref 150–400)
RBC: 4.65 MIL/uL (ref 4.22–5.81)
RDW: 14.4 % (ref 11.5–15.5)
WBC: 8.7 10*3/uL (ref 4.0–10.5)
nRBC: 0 % (ref 0.0–0.2)

## 2021-04-22 LAB — MAGNESIUM: Magnesium: 1.6 mg/dL — ABNORMAL LOW (ref 1.7–2.4)

## 2021-04-22 LAB — BASIC METABOLIC PANEL
Anion gap: 5 (ref 5–15)
BUN: 22 mg/dL — ABNORMAL HIGH (ref 6–20)
CO2: 25 mmol/L (ref 22–32)
Calcium: 8.1 mg/dL — ABNORMAL LOW (ref 8.9–10.3)
Chloride: 106 mmol/L (ref 98–111)
Creatinine, Ser: 0.92 mg/dL (ref 0.61–1.24)
GFR, Estimated: >60 mL/min (ref >60)
Glucose, Bld: 117 mg/dL — ABNORMAL HIGH (ref 70–99)
Potassium: 4.3 mmol/L (ref 3.5–5.1)
Sodium: 136 mmol/L (ref 135–145)

## 2021-04-22 MED ORDER — METHOCARBAMOL 1000 MG/10ML IJ SOLN
500.0000 mg | Freq: Three times a day (TID) | INTRAVENOUS | Status: DC
  Administered 2021-04-22 – 2021-04-24 (×7): 500 mg via INTRAVENOUS
  Filled 2021-04-22 (×4): qty 500
  Filled 2021-04-22: qty 5
  Filled 2021-04-22 (×3): qty 500

## 2021-04-22 MED ORDER — MAGNESIUM SULFATE 2 GM/50ML IV SOLN
2.0000 g | Freq: Once | INTRAVENOUS | Status: AC
  Administered 2021-04-22: 2 g via INTRAVENOUS
  Filled 2021-04-22: qty 50

## 2021-04-22 MED ORDER — SODIUM CHLORIDE 0.9 % IV BOLUS
1000.0000 mL | Freq: Once | INTRAVENOUS | Status: AC
  Administered 2021-04-22: 1000 mL via INTRAVENOUS

## 2021-04-22 NOTE — Progress Notes (Signed)
Dr. Magnus Ivan called nurse back  with orders for bolus fluid infusion, portable chest Xray and CBC in AM.

## 2021-04-22 NOTE — Progress Notes (Signed)
Rapid response nurse called and notified about patient's condition of being hypotensive and tachycardiac. Patient is alert and oriented, boluses have been given, [facility] MD called and orders for chest x-ray were done. Spoke with rapid response nurse to consult if all bases were covered and if patient could be looked at to make sure nothing was missed. Patient is resting comfortably in bed, c/o pain in abdomen prior and medication was given by assigned nurse. Assigned nurse was concerned with CT results of small PE mentioned. Will continue to monitor.

## 2021-04-22 NOTE — Progress Notes (Addendum)
[facility], rm 1621, Melody Comas 40yoM, s/p Small bowel Volvulus ischemia. Tachy&Hypotention today CT scan tonight result, Please advise.

## 2021-04-22 NOTE — Progress Notes (Signed)
2 Days Post-Op   Subjective/Chief Complaint: Reports less pain this morning No nausea No SOB  UOP increasing   Objective: Vital signs in last 24 hours: Temp:  [97.4 F (36.3 C)-99.7 F (37.6 C)] 99.7 F (37.6 C) (10/15 0442) Pulse Rate:  [102-138] 102 (10/15 0442) Resp:  [16-18] 16 (10/15 0323) BP: (83-109)/(58-79) 103/62 (10/15 0442) SpO2:  [92 %-96 %] 95 % (10/15 0442) Last BM Date: 04/20/21  Intake/Output from previous day: 10/14 0701 - 10/15 0700 In: 2949.6 [I.V.:1549.6; IV Piggyback:1400] Out: 1325 [Urine:1325] Intake/Output this shift: Total I/O In: -  Out: 250 [Urine:250]  Exam: Awake and alert Mildly tachy No resp distress, lungs clear Abdomen soft, appropriately tender, no peritonitis  Lab Results:  Recent Labs    04/21/21 1441 04/22/21 0547  WBC 11.3* 8.7  HGB 17.3* 13.9  HCT 51.7 41.8  PLT 273 211   BMET Recent Labs    04/21/21 0457 04/22/21 0547  NA 135 136  K 3.8 4.3  CL 103 106  CO2 21* 25  GLUCOSE 143* 117*  BUN 17 22*  CREATININE 0.89 0.92  CALCIUM 8.6* 8.1*   PT/INR No results for input(s): LABPROT, INR in the last 72 hours. ABG No results for input(s): PHART, HCO3 in the last 72 hours.  Invalid input(s): PCO2, PO2  Studies/Results: CT Angio Chest Pulmonary Embolism (PE) W or WO Contrast  Result Date: 04/21/2021 CLINICAL DATA:  "PE suspected, high probability". Status post laparoscopy 04/20/2021 EXAM: CT ANGIOGRAPHY CHEST WITH CONTRAST TECHNIQUE: Multidetector CT imaging of the chest was performed using the standard protocol during bolus administration of intravenous contrast. Multiplanar CT image reconstructions and MIPs were obtained to evaluate the vascular anatomy. CONTRAST:  54mL OMNIPAQUE IOHEXOL 350 MG/ML SOLN COMPARISON:  04/11/2019 chest radiograph. FINDINGS: Cardiovascular: The quality of this exam for evaluation of pulmonary embolism is poor to moderate. The bolus is poorly timed, with contrast centered in the SVC. No  central or lobar pulmonary embolism. Smaller emboli cannot be excluded, especially at the lung bases. Normal aortic caliber. Mild cardiomegaly, without pericardial effusion. Mediastinum/Nodes: No mediastinal or hilar adenopathy. Surgical clips at the proximal stomach in the setting of a small hiatal hernia. The esophagus is mildly dilated and fluid-filled. Lungs/Pleura: No pleural fluid. Trace anterior mediastinal air is likely tracking from the upper abdomen in this patient who is 1 day postop. Bibasilar dependent atelectasis. Trace left-sided pneumothorax at the apex on 09/10. Upper Abdomen: Free intraperitoneal air. Probable hepatic steatosis. Normal imaged portions of the spleen, pancreas, adrenal glands, kidneys. Fluid-filled bowel loops in the upper abdomen. Musculoskeletal: No acute osseous abnormality. Review of the MIP images confirms the above findings. IMPRESSION: 1. Limited evaluation for pulmonary embolism secondary to suboptimal bolus timing. No embolism with limitations above. 2. Free intraperitoneal air which is likely postoperative in this patient who is status post laparoscopy yesterday. 3. Trace left pneumothorax and pneumomediastinum, most likely tracking from the abdomen. 4. Small hiatal hernia. Dilated esophagus with fluid level within, suggesting gastroesophageal reflux and/or esophageal dysmotility. 5. Fluid-filled bowel loops in the upper abdomen, favoring postoperative adynamic ileus. These results will be called to the ordering clinician or representative by the Radiologist Assistant, and communication documented in the PACS or [employer]. Electronically Signed   By: Jeronimo Greaves M.D.   On: 04/21/2021 20:34   CT ABDOMEN PELVIS W CONTRAST  Addendum Date: 04/20/2021   ADDENDUM REPORT: 04/20/2021 17:20 ADDENDUM: These results were called by telephone at the time of interpretation on 04/20/2021 at 5:18  pm to provider Marianna Fuss , who verbally acknowledged these results.  Electronically Signed   By: Tish Frederickson M.D.   On: 04/20/2021 17:20   Result Date: 04/20/2021 CLINICAL DATA:  Abdominal pain, acute, nonlocalized severe sudden abodminal pain; concern for obstruction, volvulus EXAM: CT ABDOMEN AND PELVIS WITH CONTRAST TECHNIQUE: Multidetector CT imaging of the abdomen and pelvis was performed using the standard protocol following bolus administration of intravenous contrast. CONTRAST:  OMNIPAQUE IOHEXOL 300 MG/ML  SOLN COMPARISON:  CT abdomen pelvis 12/29/2020, CT abdomen pelvis 06/01/2016 FINDINGS: Lower chest: No acute abnormality.  Small hiatal hernia. Hepatobiliary: No focal liver abnormality. Status post cholecystectomy. No biliary dilatation. Pancreas: No focal lesion. Normal pancreatic contour. No surrounding inflammatory changes. No main pancreatic ductal dilatation. Normal in size without focal abnormality. Spleen: Normal in size without focal abnormality. Adrenals/Urinary Tract: No adrenal nodule bilaterally. Bilateral kidneys enhance symmetrically. No hydronephrosis. No hydroureter. The urinary bladder is unremarkable. Stomach/Bowel: Status post Roux-en-Y gastric bypass. Two regions of persistent swirling of the small bowel mesentery is noted (2:35, 50). Associated mesenteric edema. Stomach is within normal limits. Poor enhancement of the wall of the small bowel associated with the mid abdominal and jejunojejunostomy (5:85, 2:33). No definite pneumatosis. No evidence of bowel wall thickening or dilatation. Appendix appears normal. Vascular/Lymphatic: No abdominal aorta or iliac aneurysm. No abdominal, pelvic, or inguinal lymphadenopathy. Reproductive: Prostate is unremarkable. Other: No intraperitoneal free fluid. No intraperitoneal free gas. No organized fluid collection. Musculoskeletal: No abdominal wall hernia or abnormality. No suspicious lytic or blastic osseous lesions. No acute displaced fracture. IMPRESSION: 1. Concern for mesenteric volvulus and  small bowel ischemia in the setting of persistent mesenteric swirling, increased mesenteric edema, and interval development of non-enhancement of the small bowel associated with the mid abdominal jejunojejunostomy. No definite pneumatosis. No associated bowel obstruction or perforation. Recommend correlation with lactate levels and surgical consultation. 2. Status post Roux-en-Y gastric bypass. Electronically Signed: By: Tish Frederickson M.D. 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 Andrey Campanile   Appreciate cards consult.  No cardiac issues so they have signed off CT negative for PE  Improving with volume resuscitation. Will try liquids Transfer to surgical floor.   Abigail Miyamoto 04/22/2021

## 2021-04-22 NOTE — Progress Notes (Signed)
Rapid Response Event Note   Reason for Call : pt has low b/p, increased HR, face flushed   Initial Focused Assessment: Pt A/O, f/c.  Lung sounds clear bilaterally.  Seems to have complained of some abdominal pain prior to receiving pain medication.  Abdominal dressing intact old stain areas are marked. Abdomen soft.  Interventions: Pt placed on monitor VSS. See flow sheet. Pt received NS bolus.   Plan of Care: Pt will remain in current location as to VSS on Room Air.  Pt has no obvious S/S of distress.  CT results to [facility]. MD earlier during the night per notes.       Conley Rolls, RN

## 2021-04-22 NOTE — Progress Notes (Signed)
[place], rm 16[address] Melody Comas 40yoM Just a Reminder on CT scan tonight. Radiologist wanted  to make sure you saw results tonight.  Thanks.  Nurse was waiting for hospitalist to call back but not answering for a while so reach out a gain and was told this patient is not a [facility] patient.  I notified Cardiology because they had written seen this patient earlier.

## 2021-04-22 NOTE — Progress Notes (Signed)
Transfer orders placed for [facility]. Bed available. Report called to [facility].   Spoke with MD Magnus Ivan via secure chat- ok for tele to be discontinued for transfer to [facility].

## 2021-04-22 NOTE — Progress Notes (Signed)
   04/22/21 0956  Assess: MEWS Score  Temp 99 F (37.2 C)  BP 107/72  Pulse Rate (!) 117  Resp 18  Level of Consciousness Alert  SpO2 95 %  O2 Device Room Air  Assess: MEWS Score  MEWS Temp 0  MEWS Systolic 0  MEWS Pulse 2  MEWS RR 0  MEWS LOC 0  MEWS Score 2  MEWS Score Color Yellow  Assess: if the MEWS score is Yellow or Red  Were vital signs taken at a resting state? Yes  Focused Assessment Change from prior assessment (see assessment flowsheet)  Does the patient meet 2 or more of the SIRS criteria? No  MEWS guidelines implemented *See Row Information* Yes  Take Vital Signs  Increase Vital Sign Frequency  Yellow: Q 2hr X 2 then Q 4hr X 2, if remains yellow, continue Q 4hrs  Notify: Charge Nurse/RN  Name of Charge Nurse/RN Notified leann owen  Date Charge Nurse/RN Notified 04/22/21  Time Charge Nurse/RN Notified 0958  Assess: SIRS CRITERIA  SIRS Temperature  0  SIRS Pulse 1  SIRS Respirations  0  SIRS WBC 0  SIRS Score Sum  1

## 2021-04-23 LAB — BASIC METABOLIC PANEL
Anion gap: 5 (ref 5–15)
BUN: 10 mg/dL (ref 6–20)
CO2: 25 mmol/L (ref 22–32)
Calcium: 8.2 mg/dL — ABNORMAL LOW (ref 8.9–10.3)
Chloride: 106 mmol/L (ref 98–111)
Creatinine, Ser: 0.69 mg/dL (ref 0.61–1.24)
GFR, Estimated: >60 mL/min (ref >60)
Glucose, Bld: 98 mg/dL (ref 70–99)
Potassium: 3.9 mmol/L (ref 3.5–5.1)
Sodium: 136 mmol/L (ref 135–145)

## 2021-04-23 LAB — CBC
HCT: 38 % — ABNORMAL LOW (ref 39.0–52.0)
Hemoglobin: 12.5 g/dL — ABNORMAL LOW (ref 13.0–17.0)
MCH: 29.4 pg (ref 26.0–34.0)
MCHC: 32.9 g/dL (ref 30.0–36.0)
MCV: 89.4 fL (ref 80.0–100.0)
Platelets: 198 10*3/uL (ref 150–400)
RBC: 4.25 MIL/uL (ref 4.22–5.81)
RDW: 14.1 % (ref 11.5–15.5)
WBC: 7.2 10*3/uL (ref 4.0–10.5)
nRBC: 0 % (ref 0.0–0.2)

## 2021-04-23 MED ORDER — SODIUM CHLORIDE 0.9% FLUSH
3.0000 mL | Freq: Two times a day (BID) | INTRAVENOUS | Status: DC
  Administered 2021-04-23: 3 mL via INTRAVENOUS

## 2021-04-23 MED ORDER — PHENOL 1.4 % MT LIQD: 2.0000 | OROMUCOSAL | Status: DC | PRN

## 2021-04-23 MED ORDER — ACETAMINOPHEN 500 MG PO TABS
1000.0000 mg | ORAL_TABLET | Freq: Four times a day (QID) | ORAL | Status: DC
  Administered 2021-04-23 – 2021-04-24 (×5): 1000 mg via ORAL
  Filled 2021-04-23 (×5): qty 2

## 2021-04-23 MED ORDER — SODIUM CHLORIDE 0.9 % IV SOLN
250.0000 mL | INTRAVENOUS | Status: DC | PRN
  Administered 2021-04-23: 10 mL via INTRAVENOUS

## 2021-04-23 MED ORDER — METFORMIN HCL 500 MG PO TABS: 500.0000 mg | ORAL_TABLET | Freq: Every day | ORAL | Status: DC

## 2021-04-23 MED ORDER — ALUM & MAG HYDROXIDE-SIMETH 200-200-20 MG/5ML PO SUSP: 30.0000 mL | Freq: Four times a day (QID) | ORAL | Status: DC | PRN

## 2021-04-23 MED ORDER — GABAPENTIN 300 MG PO CAPS
300.0000 mg | ORAL_CAPSULE | Freq: Three times a day (TID) | ORAL | Status: DC
  Administered 2021-04-23 – 2021-04-24 (×4): 300 mg via ORAL
  Filled 2021-04-23 (×4): qty 1

## 2021-04-23 MED ORDER — LACTATED RINGERS IV BOLUS: 1000.0000 mL | Freq: Three times a day (TID) | INTRAVENOUS | Status: DC | PRN

## 2021-04-23 MED ORDER — CALCIUM POLYCARBOPHIL 625 MG PO TABS
625.0000 mg | ORAL_TABLET | Freq: Two times a day (BID) | ORAL | Status: DC
  Administered 2021-04-23 – 2021-04-24 (×3): 625 mg via ORAL
  Filled 2021-04-23 (×3): qty 1

## 2021-04-23 MED ORDER — TRAMADOL HCL 50 MG PO TABS
50.0000 mg | ORAL_TABLET | Freq: Four times a day (QID) | ORAL | Status: DC | PRN
  Administered 2021-04-23: 50 mg via ORAL
  Filled 2021-04-23: qty 1

## 2021-04-23 MED ORDER — CALCIUM CITRATE 950 (200 CA) MG PO TABS
200.0000 mg | ORAL_TABLET | Freq: Every day | ORAL | Status: DC
  Administered 2021-04-23 – 2021-04-24 (×2): 200 mg via ORAL
  Filled 2021-04-23 (×2): qty 1

## 2021-04-23 MED ORDER — METOPROLOL TARTRATE 5 MG/5ML IV SOLN: 5.0000 mg | Freq: Four times a day (QID) | INTRAVENOUS | Status: DC | PRN

## 2021-04-23 MED ORDER — MENTHOL 3 MG MT LOZG: 1.0000 | LOZENGE | OROMUCOSAL | Status: DC | PRN

## 2021-04-23 MED ORDER — PANTOPRAZOLE SODIUM 40 MG PO TBEC
40.0000 mg | DELAYED_RELEASE_TABLET | Freq: Two times a day (BID) | ORAL | Status: DC
  Administered 2021-04-23 – 2021-04-24 (×2): 40 mg via ORAL
  Filled 2021-04-23 (×2): qty 1

## 2021-04-23 MED ORDER — SODIUM CHLORIDE 0.9% FLUSH: 3.0000 mL | INTRAVENOUS | Status: DC | PRN

## 2021-04-23 MED ORDER — METOCLOPRAMIDE HCL 5 MG/ML IJ SOLN: 5.0000 mg | Freq: Three times a day (TID) | INTRAMUSCULAR | Status: DC | PRN

## 2021-04-23 MED ORDER — GABAPENTIN 100 MG PO CAPS: 200.0000 mg | ORAL_CAPSULE | Freq: Three times a day (TID) | ORAL | Status: DC

## 2021-04-23 MED ORDER — PROCHLORPERAZINE EDISYLATE 10 MG/2ML IJ SOLN: 5.0000 mg | INTRAMUSCULAR | Status: DC | PRN

## 2021-04-23 MED ORDER — ADULT MULTIVITAMIN W/MINERALS CH
1.0000 | ORAL_TABLET | Freq: Every day | ORAL | Status: DC
  Administered 2021-04-23 – 2021-04-24 (×2): 1 via ORAL
  Filled 2021-04-23 (×2): qty 1

## 2021-04-23 NOTE — Plan of Care (Signed)
  Problem: Education: Goal: Knowledge of General Education information will improve Description: Including pain rating scale, medication(s)/side effects and non-pharmacologic comfort measures Outcome: Progressing   Problem: Coping: Goal: Level of anxiety will decrease Outcome: Progressing   

## 2021-04-23 NOTE — Progress Notes (Signed)
ETHON WYMER [id number] February 17, 1981  CARE TEAM:  PCP: Rodrigo Ran, MD  Outpatient Care Team: Patient Care Team: Rodrigo Ran, MD as PCP - General (Internal Medicine)  Inpatient Treatment Team: Treatment Team: Attending Provider: Montez Morita Md, MD; Registered Nurse: Terence Lux, RN; Consulting Physician: Montez Morita Md, MD; Charge Nurse: Sherian Maroon, RN; Utilization Review: Berna Bue, RN; Case Manager: Bartholome Bill, RN   Problem List:   Active Problems:   Internal hernia   SBO (small bowel obstruction) (HCC)   3 Days Post-Op  04/20/2021  POSTOPERATIVE DIAGNOSES: 1.  Small bowel volvulus with resolved ischemia. 2.  History of laparoscopic Roux-en-Y gastric bypass in 05/2015 at [facility]. 3.  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   PROCEDURES: 1.  Diagnostic laparoscopy converted to exploratory laparotomy. 2.  Reduction of small bowel volvulus. 3.  Closure of Petersen's space hernia.   SURGEON:  Mary Sella. Andrey Campanile, MD    Assessment  Recovering  [facility] Stay = 3 days)  Assessment/Plan Hx laparoscopic Roux-en-Y gastric bypass 05/2015 at [facility]   Small bowel volvulus due to Petersen's space hernia -POD#1 S/p Diagnostic laparoscopy converted to exploratory laparotomy, Reduction of small bowel volvulus, Closure of Petersen's space hernia 10/13 Dr. Andrey Campanile - Use zofran that is ordered for nausea -Ileus resolving.  Advance diet as bariatric protocol for now - mobilize - Tachycardic with chest pain -EKG, troponins, CT of chest negative.  On room air.  Resolving gradually.  Follow closely -Stop IV fluids with backup boluses as needed -Pain control.  Transition to oral regimen with IV backup.  Scheduled Tylenol, gabapentin (looks like he was on 600 3 times daily at home, start 300mg   3 times daily for now).  PO narcotics and muscle relaxant -PPI per home regimen -Remove staples at port sites.  Keep midline incision staples most  likely till postop day 10.  Defer to operating surgeon, Dr. .   ID - zosyn x1 FEN -Medlock IV fluids 10/16 with IV fluid bolus backup.  Recheck labs in morning.  Gradually advance diet. VTE - lovenox Foley - none  Disposition:  Disposition:  The patient is from: Home  Anticipate discharge to:  Home  Anticipated Date of Discharge is:  October 18,2022    Barriers to discharge:  Pending Clinical improvement (more likely than not)  Patient currently is NOT MEDICALLY STABLE for discharge from the hospital from a surgery standpoint.      25 minutes spent in review, evaluation, examination, counseling, and coordination of care.   I have reviewed this patient's available data, including medical history, events of note, physical examination and test results as part of my evaluation.  A significant portion of that time was spent in counseling.  Care during the described time interval was provided by me.  04/23/2021    Subjective: (Chief complaint)  Transfer off telemetry on floor.  Starting to walk.  Starting to pass some gas.  Tolerating some liquids.  Pain mostly controlled.  Still some tachycardia but improving.  Nurse 04/25/2021 in room  Objective:  Vital signs:  Vitals:   04/22/21 2126 04/22/21 2126 04/23/21 0148 04/23/21 0538  BP: 117/68 117/68 120/76 116/68  Pulse: (!) 112 (!) 111 (!) 107 (!) 104  Resp: 16 16 16 16   Temp: 98.8 F (37.1 C) 98.8 F (37.1 C) 98 F (36.7 C) 98.6 F (37 C)  TempSrc: Oral Oral Oral Oral  SpO2: 94% 94% 95% 95%  Weight:  Height:        Last BM Date: 04/21/21  Intake/Output   Yesterday:  10/15 0701 - 10/16 0700 In: 4115.2 [P.O.:1080; I.V.:2793.2; IV Piggyback:241.9] Out: 2770 [Urine:2770] This shift:  No intake/output data recorded.  Bowel function:  Flatus: YES  BM:  No  Drain: (No drain)   Physical Exam:  General: Pt awake/alert in no acute distress Eyes: PERRL, normal EOM.  Sclera clear.  No  icterus Neuro: CN II-XII intact w/o focal sensory/motor deficits. Lymph: No head/neck/groin lymphadenopathy Psych:  No delerium/psychosis/paranoia.  Oriented x 4 HENT: Normocephalic, Mucus membranes moist.  No thrush Neck: Supple, No tracheal deviation.  No obvious thyromegaly Chest: No pain to chest wall compression.  Good respiratory excursion.  No audible wheezing CV:  Pulses intact.  Regular rhythm.  No major extremity edema MS: Normal AROM mjr joints.  No obvious deformity  Abdomen: Soft.  Nondistended.  Mildly tender at incisions only.  Remove the dressings and wicks.  Port site incisions closed.  No evidence of peritonitis.  No incarcerated hernias.  Ext:   No deformity.  No mjr edema.  No cyanosis Skin: No petechiae / purpurea.  No major sores.  Warm and dry    Results:   Cultures: Recent Results (from the past 720 hour(s))  Resp Panel by RT-PCR (Flu A&B, Covid) Nasopharyngeal Swab     Status: None   Collection Time: 04/20/21  5:25 PM   Specimen: Nasopharyngeal Swab; Nasopharyngeal(NP) swabs in vial transport medium  Result Value Ref Range Status   SARS Coronavirus 2 by RT PCR NEGATIVE NEGATIVE Final    Comment: (NOTE) SARS-CoV-2 target nucleic acids are NOT DETECTED.  The SARS-CoV-2 RNA is generally detectable in upper respiratory specimens during the acute phase of infection. The lowest concentration of SARS-CoV-2 viral copies this assay can detect is 138 copies/mL. A negative result does not preclude SARS-Cov-2 infection and should not be used as the sole basis for treatment or other patient management decisions. A negative result may occur with  improper specimen collection/handling, submission of specimen other than nasopharyngeal swab, presence of viral mutation(s) within the areas targeted by this assay, and inadequate number of viral copies(<138 copies/mL). A negative result must be combined with clinical observations, patient history, and  epidemiological information. The expected result is Negative.  Fact Sheet for Patients:  BloggerCourse.com  Fact Sheet for Healthcare Providers:  SeriousBroker.it  This test is no t yet approved or cleared by the [place] FDA and  has been authorized for detection and/or diagnosis of SARS-CoV-2 by FDA under an Emergency Use Authorization (EUA). This EUA will remain  in effect (meaning this test can be used) for the duration of the COVID-19 declaration under Section 564(b)(1) of the Act, 21 U.S.C.section 360bbb-3(b)(1), unless the authorization is terminated  or revoked sooner.       Influenza A by PCR NEGATIVE NEGATIVE Final   Influenza B by PCR NEGATIVE NEGATIVE Final    Comment: (NOTE) The Xpert Xpress SARS-CoV-2/FLU/RSV plus assay is intended as an aid in the diagnosis of influenza from Nasopharyngeal swab specimens and should not be used as a sole basis for treatment. Nasal washings and aspirates are unacceptable for Xpert Xpress SARS-CoV-2/FLU/RSV testing.  Fact Sheet for Patients: BloggerCourse.com  Fact Sheet for Healthcare Providers: SeriousBroker.it  This test is not yet approved or cleared by the [place] FDA and has been authorized for detection and/or diagnosis of SARS-CoV-2 by FDA under an Emergency Use Authorization (EUA). This EUA will remain in  effect (meaning this test can be used) for the duration of the COVID-19 declaration under Section 564(b)(1) of the Act, 21 U.S.C. section 360bbb-3(b)(1), unless the authorization is terminated or revoked.  Performed at [facility], [address] [place], [place] [address]     Labs: Results for orders placed or performed during the hospital encounter of 04/20/21 (from the past 48 hour(s))  Glucose, capillary     Status: Abnormal   Collection Time: 04/21/21 11:45 AM  Result Value Ref  Range   Glucose-Capillary 104 (H) 70 - 99 mg/dL    Comment: Glucose reference range applies only to samples taken after fasting for at least 8 hours.  CBC     Status: Abnormal   Collection Time: 04/21/21  2:41 PM  Result Value Ref Range   WBC 11.3 (H) 4.0 - 10.5 K/uL   RBC 5.79 4.22 - 5.81 MIL/uL   Hemoglobin 17.3 (H) 13.0 - 17.0 g/dL   HCT 62.8 31.5 - 17.6 %   MCV 89.3 80.0 - 100.0 fL   MCH 29.9 26.0 - 34.0 pg   MCHC 33.5 30.0 - 36.0 g/dL   RDW 16.0 73.7 - 10.6 %   Platelets 273 150 - 400 K/uL   nRBC 0.0 0.0 - 0.2 %    Comment: Performed at [facility], [address] [place], [place] [address]  Basic metabolic panel     Status: Abnormal   Collection Time: 04/22/21  5:47 AM  Result Value Ref Range   Sodium 136 135 - 145 mmol/L   Potassium 4.3 3.5 - 5.1 mmol/L   Chloride 106 98 - 111 mmol/L   CO2 25 22 - 32 mmol/L   Glucose, Bld 117 (H) 70 - 99 mg/dL    Comment: Glucose reference range applies only to samples taken after fasting for at least 8 hours.   BUN 22 (H) 6 - 20 mg/dL   Creatinine, Ser 5.46 0.61 - 1.24 mg/dL   Calcium 8.1 (L) 8.9 - 10.3 mg/dL   GFR, Estimated >27 >03 mL/min    Comment: (NOTE) Calculated using the CKD-EPI Creatinine Equation (2021)    Anion gap 5 5 - 15    Comment: Performed at [facility], [address], [place], [place] [address]  CBC     Status: None   Collection Time: 04/22/21  5:47 AM  Result Value Ref Range   WBC 8.7 4.0 - 10.5 K/uL   RBC 4.65 4.22 - 5.81 MIL/uL   Hemoglobin 13.9 13.0 - 17.0 g/dL   HCT 81.8 29.9 - 37.1 %   MCV 89.9 80.0 - 100.0 fL   MCH 29.9 26.0 - 34.0 pg   MCHC 33.3 30.0 - 36.0 g/dL   RDW 69.6 78.9 - 38.1 %   Platelets 211 150 - 400 K/uL   nRBC 0.0 0.0 - 0.2 %    Comment: Performed at [facility], [address] [place], [place] [address]  Magnesium     Status: Abnormal   Collection Time: 04/22/21  5:47 AM  Result Value Ref Range   Magnesium 1.6 (L) 1.7 -  2.4 mg/dL    Comment: Performed at [facility], [address], [place], [place] [address]  Basic metabolic panel     Status: Abnormal   Collection Time: 04/23/21  3:51 AM  Result Value Ref Range   Sodium 136 135 - 145 mmol/L   Potassium 3.9 3.5 - 5.1 mmol/L   Chloride  106 98 - 111 mmol/L   CO2 25 22 - 32 mmol/L   Glucose, Bld 98 70 - 99 mg/dL    Comment: Glucose reference range applies only to samples taken after fasting for at least 8 hours.   BUN 10 6 - 20 mg/dL   Creatinine, Ser 0.97 0.61 - 1.24 mg/dL   Calcium 8.2 (L) 8.9 - 10.3 mg/dL   GFR, Estimated >35 >32 mL/min    Comment: (NOTE) Calculated using the CKD-EPI Creatinine Equation (2021)    Anion gap 5 5 - 15    Comment: Performed at [facility], [address], [place], [place] [address]  CBC     Status: Abnormal   Collection Time: 04/23/21  3:51 AM  Result Value Ref Range   WBC 7.2 4.0 - 10.5 K/uL   RBC 4.25 4.22 - 5.81 MIL/uL   Hemoglobin 12.5 (L) 13.0 - 17.0 g/dL   HCT 68.3 (L) 41.9 - 62.2 %   MCV 89.4 80.0 - 100.0 fL   MCH 29.4 26.0 - 34.0 pg   MCHC 32.9 30.0 - 36.0 g/dL   RDW 29.7 98.9 - 21.1 %   Platelets 198 150 - 400 K/uL   nRBC 0.0 0.0 - 0.2 %    Comment: Performed at [facility] [address] [place], [place] [address]    Imaging / Studies: CT Angio Chest Pulmonary Embolism (PE) W or WO Contrast  Result Date: 04/21/2021 CLINICAL DATA:  "PE suspected, high probability". Status post laparoscopy 04/20/2021 EXAM: CT ANGIOGRAPHY CHEST WITH CONTRAST TECHNIQUE: Multidetector CT imaging of the chest was performed using the standard protocol during bolus administration of intravenous contrast. Multiplanar CT image reconstructions and MIPs were obtained to evaluate the vascular anatomy. CONTRAST:  57mL OMNIPAQUE IOHEXOL 350 MG/ML SOLN COMPARISON:  04/11/2019 chest radiograph. FINDINGS: Cardiovascular: The quality of this exam for evaluation of pulmonary  embolism is poor to moderate. The bolus is poorly timed, with contrast centered in the SVC. No central or lobar pulmonary embolism. Smaller emboli cannot be excluded, especially at the lung bases. Normal aortic caliber. Mild cardiomegaly, without pericardial effusion. Mediastinum/Nodes: No mediastinal or hilar adenopathy. Surgical clips at the proximal stomach in the setting of a small hiatal hernia. The esophagus is mildly dilated and fluid-filled. Lungs/Pleura: No pleural fluid. Trace anterior mediastinal air is likely tracking from the upper abdomen in this patient who is 1 day postop. Bibasilar dependent atelectasis. Trace left-sided pneumothorax at the apex on 09/10. Upper Abdomen: Free intraperitoneal air. Probable hepatic steatosis. Normal imaged portions of the spleen, pancreas, adrenal glands, kidneys. Fluid-filled bowel loops in the upper abdomen. Musculoskeletal: No acute osseous abnormality. Review of the MIP images confirms the above findings. IMPRESSION: 1. Limited evaluation for pulmonary embolism secondary to suboptimal bolus timing. No embolism with limitations above. 2. Free intraperitoneal air which is likely postoperative in this patient who is status post laparoscopy yesterday. 3. Trace left pneumothorax and pneumomediastinum, most likely tracking from the abdomen. 4. Small hiatal hernia. Dilated esophagus with fluid level within, suggesting gastroesophageal reflux and/or esophageal dysmotility. 5. Fluid-filled bowel loops in the upper abdomen, favoring postoperative adynamic ileus. These results will be called to the ordering clinician or representative by the Radiologist Assistant, and communication documented in the PACS or [employer]. Electronically Signed   By: Jeronimo Greaves M.D.   On: 04/21/2021 20:34   DG Chest Port 1 View  Result Date: 04/22/2021 CLINICAL DATA:  Dyspnea and tachycardia EXAM: PORTABLE CHEST 1 VIEW  COMPARISON:  05/08/2019 and the CTA of 1 day prior FINDINGS:  Midline trachea. Mild cardiomegaly. Pneumomediastinum on yesterday's CT is subtly apparent along the right heart border. The trace left apical pneumothorax on yesterday's CT is not readily apparent. No pleural fluid. Mild subsegmental atelectasis at the lung bases. IMPRESSION: No acute cardiopulmonary disease. Subtle pneumomediastinum, as on yesterday's CT. Electronically Signed   By: Jeronimo Greaves M.D.   On: 04/22/2021 08:24    Medications / Allergies: per chart  Antibiotics: Anti-infectives (From admission, onward)    Start     Dose/Rate Route Frequency Ordered Stop   04/20/21 1745  piperacillin-tazobactam (ZOSYN) IVPB 3.375 g        3.375 g 100 mL/hr over 30 Minutes Intravenous  Once 04/20/21 1734 04/20/21 1824         Note: Portions of this report may have been transcribed using voice recognition software. Every effort was made to ensure accuracy; however, inadvertent computerized transcription errors may be present.   Any transcriptional errors that result from this process are unintentional.    Ardeth Sportsman, MD, FACS, MASCRS Esophageal, Gastrointestinal & Colorectal Surgery Robotic and Minimally Invasive Surgery  [facility], [facility]  [facility]  [address] [place], [place] [address] [phone] Fax [phone] Main  CONTACT INFORMATION:  Weekday (9AM-5PM): Call [facility] main office at [phone]  Weeknight (5PM-9AM) or Weekend/Holiday: Check [email or web address] (password " [email or web address]") for General Surgery [facility] coverage  (Please, do not use SecureChat as it is not reliable communication to operating surgeons for immediate patient care)      04/23/2021  10:41 AM

## 2021-04-24 MED ORDER — THIAMINE HCL 100 MG PO TABS: 100.0000 mg | ORAL_TABLET | Freq: Every day | ORAL | Status: DC

## 2021-04-24 MED ORDER — THIAMINE HCL 100 MG PO TABS: 100.0000 mg | ORAL_TABLET | Freq: Every day | ORAL | 0 refills | Status: DC

## 2021-04-24 MED ORDER — TRAMADOL HCL 50 MG PO TABS: 50.0000 mg | ORAL_TABLET | Freq: Four times a day (QID) | ORAL | 0 refills | Status: DC | PRN

## 2021-04-24 MED ORDER — ONDANSETRON 4 MG PO TBDP: 4.0000 mg | ORAL_TABLET | Freq: Four times a day (QID) | ORAL | 0 refills | Status: DC | PRN

## 2021-04-24 NOTE — Discharge Summary (Signed)
Patient ID: Robert Hess [id number] Oct 11, 1980 40 y.o.  Admit date: 04/20/2021 Discharge date: 04/24/2021  Admitting Diagnosis: 1.  Small bowel volvulus with ischemia. 2.  History of laparoscopic Roux-en-Y gastric bypass in 05/2015 at [facility].  Discharge Diagnosis 1.  Small bowel volvulus with resolved ischemia. 2.  History of laparoscopic Roux-en-Y gastric bypass in 05/2015 at [facility]. 3.  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 4. S/p Diagnostic laparoscopy converted to exploratory laparotomy, Reduction of small bowel volvulus, Closure of Petersen's space hernia on 10/13 by Dr. Andrey Campanile   Consultants Cardiology   H&P [name] is an 40 y.o. male who [facility] evaluation after presenting there with severe worsening lower abdominal pain.  He has a history of laparoscopic adjustable gastric band placement which was removed in 2016 followed a few months later in November 2016 by laparoscopic Roux-en-Y antecolic antigastric gastric bypass at [facility] by Dr. Lily Peer.  His initial visit weight at [facility] was 341 pounds.   He states that he has been having a few days of postprandial abdominal pain and bloating.  It worsened last evening and acutely worsened today.  He reports subjective chills along with nausea and vomiting.  History is somewhat limited because the patient is writhing in pain on the hospital gurney clutching his abdomen   He reports that he has been taking his multivitamin calcium.  He had a small BM this morning.   He presented with similar complaints in June of this summer with concerns of mesenteric volvulus on CT imaging.  He underwent diagnostic laparoscopy and detorsion of mesenteric volvulus by one of my partners.  The small bowel had twisted on its entire pedicle.  He was found to have an antecolic antigastric Roux limb with its mesentery oriented towards the  patient's right side/superiorly and the jejunojejunostomy mesenteric closure was intact.  The Vonita Moss space was found not to be closed but given the configuration of his BP limb and Roux limb in order to close this and to keep the bowel from twisting it was felt it would require an anastomotic revision and was left alone and the patient was instructed to follow-up with [facility]  Procedures Dr. Andrey Campanile - 04/20/2021 1.  Diagnostic laparoscopy converted to exploratory laparotomy. 2.  Reduction of small bowel volvulus. 3.  Closure of Petersen's space hernia.  Hospital Course:  [name] is an 40 y.o. male with history of laparoscopic Roux-en-Y gastric bypass at [facility] in November 2016 who presented w/ abdominal pain with peritonitis and concerns for mesenteric volvulus and small bowel ischemia based on CT imaging as well as elevated lactate. Patient was taken to the OR by Dr. Andrey Campanile on 10/13 and underwent diagnostic laparoscopy converted to exploratory laparotomy, reduction of small bowel volvulus, and closure of Petersen's space hernia. Patient tolerated the procedure well and was transferred to the floor. On POD 1 patient developed tachycardia and cp. CTA negative for PE. This did show small PTX that resolved on repeat CXR. Cardiology was consulted and patient underwent workup that was reassuring. He was cleared by cardiology who signed off. CP resolved. Tachycardia improved. Patients diet was advanced and tolerated. On POD 4, the patient was voiding well, tolerating diet, ambulating well, pain well controlled, vital signs stable, incisions c/d/i and felt stable for discharge home. Discharge and return precautions were discussed. Follow up as noted below.  Allergies as of 04/24/2021       Reactions   Other Hives   *Rose Hair on a spider* (tarantula hair)   Nsaids    Had gastric bypass and unable to take.   Oxycodone Nausea Only        Medication List     TAKE  these medications    acetaminophen 500 MG tablet Commonly known as: TYLENOL Take 2 tablets (1,000 mg total) by mouth every 6 (six) hours as needed.   buPROPion 100 MG tablet Commonly known as: WELLBUTRIN Take 100 mg by mouth daily as needed.   CALCIUM CITRATE PO Take 1 tablet by mouth daily.   [employer] LANSOPRAZOLE PO Take 1 capsule by mouth daily.   gabapentin 300 MG capsule Commonly known as: NEURONTIN Take 600 mg by mouth 3 (three) times daily.   metFORMIN 500 MG tablet Commonly known as: GLUCOPHAGE Take 500 mg by mouth daily.   Multiple Vitamins tablet Take 1 tablet by mouth daily.   ondansetron 4 MG disintegrating tablet Commonly known as: ZOFRAN-ODT Take 1 tablet (4 mg total) by mouth every 6 (six) hours as needed for nausea.   Ozempic (1 MG/DOSE) 4 MG/3ML Sopn Generic drug: Semaglutide (1 MG/DOSE) Inject 1 mg into the skin once a week.   thiamine 100 MG tablet Take 1 tablet (100 mg total) by mouth daily. Start taking on: April 25, 2021   traMADol 50 MG tablet Commonly known as: ULTRAM Take 1 tablet (50 mg total) by mouth every 6 (six) hours as needed (breakthrough pain). What changed: reasons to take this          Follow-up Information     [facility] Go on 05/01/2021.   Specialty: General Surgery Why: Your appointment is 05/01/21 at 10am for staple removal Please arrive 30 minutes prior to your appointment to check in and fill out paperwork. Designer, fashion/clothing ID and Insurance account manager information: [address] [place] [address] [phone]        Gaynelle Adu, MD. Nyra Capes on 05/10/2021.   Specialty: General Surgery Why: Your appointment is 05/10/21 at 9am Please arrive 15 minutes early to check in. Contact information: [address] [place] [address] [phone]                 Signed: Leary Roca, [facility] 04/24/2021, 3:09 PM Please  see Amion for pager number during day hours 7:00am-4:30pm

## 2021-04-24 NOTE — Progress Notes (Signed)
Discharge instructions given to patient and all questions were answered.  

## 2021-04-24 NOTE — Discharge Instructions (Signed)
[facility]      [facility] °[phone] ° °OPEN ABDOMINAL SURGERY: POST OP INSTRUCTIONS ° °Always review your discharge instruction sheet given to you by the facility where your surgery was performed. ° °IF YOU HAVE DISABILITY OR FAMILY LEAVE FORMS, YOU MUST BRING THEM TO THE OFFICE FOR PROCESSING.  PLEASE DO NOT GIVE THEM TO YOUR DOCTOR. ° °A prescription for pain medication may be given to you upon discharge.  Take your pain medication as prescribed, if needed.  If narcotic pain medicine is not needed, then you may take acetaminophen (Tylenol) or ibuprofen (Advil) as needed. °Take your usually prescribed medications unless otherwise directed. °If you need a refill on your pain medication, please contact your pharmacy. They will contact our office to request authorization.  Prescriptions will not be filled after 5pm or on week-ends. °You should follow a light diet the first few days after arrival home, such as soup and crackers, pudding, etc.unless your doctor has advised otherwise. A high-fiber, low fat diet can be resumed as tolerated.   Be sure to include lots of fluids daily. Most patients will experience some swelling and bruising on the chest and neck area.  Ice packs will help.  Swelling and bruising can take several days to resolve °Most patients will experience some swelling and bruising in the area of the incision. Ice pack will help. Swelling and bruising can take several days to resolve..  °It is common to experience some constipation if taking pain medication after surgery.  Increasing fluid intake and taking a stool softener will usually help or prevent this problem from occurring.  A mild laxative (Milk of Magnesia or Miralax) should be taken according to package directions if there are no bowel movements after 48 hours. ° You may have steri-strips (small skin tapes) in place directly over the incision.  These strips should be left on the skin for 7-10 days.  If your surgeon used skin  glue on the incision, you may shower in 24 hours.  The glue will flake off over the next 2-3 weeks.  Any sutures or staples will be removed at the office during your follow-up visit. You may find that a light gauze bandage over your incision may keep your staples from being rubbed or pulled. You may shower and replace the bandage daily. °ACTIVITIES:  You may resume regular (light) daily activities beginning the next day--such as daily self-care, walking, climbing stairs--gradually increasing activities as tolerated.  You may have sexual intercourse when it is comfortable.  Refrain from any heavy lifting or straining until approved by your doctor. °You may drive when you no longer are taking prescription pain medication, you can comfortably wear a seatbelt, and you can safely maneuver your car and apply brakes ° °You should see your doctor in the office for a follow-up appointment approximately two weeks after your surgery.  Make sure that you call for this appointment within a day or two after you arrive home to insure a convenient appointment time. ° °WHEN TO CALL YOUR DOCTOR: °Fever over 101.0 °Inability to urinate °Nausea and/or vomiting °Extreme swelling or bruising °Continued bleeding from incision. °Increased pain, redness, or drainage from the incision. °Difficulty swallowing or breathing °Muscle cramping or spasms. °Numbness or tingling in hands or feet or around lips. ° °The clinic staff is available to answer your questions during regular business hours.  Please don’t hesitate to call and ask to speak to one of the nurses if you have concerns. ° °For   further questions, please visit [email or web address]  °

## 2021-04-24 NOTE — Progress Notes (Signed)
PHARMACIST - PHYSICIAN COMMUNICATION  DR:   Dwain Sarna  CONCERNING: IV to Oral Route Change Policy  RECOMMENDATION: This patient is receiving thiamine by the intravenous route.  Based on criteria approved by the Pharmacy and Therapeutics Committee, the intravenous medication(s) is/are being converted to the equivalent oral dose form(s).   DESCRIPTION: These criteria include: The patient is eating (either orally or via tube) and/or has been taking other orally administered medications for a least 24 hours The patient has no evidence of active gastrointestinal bleeding or impaired GI absorption (gastrectomy, short bowel, patient on TNA or NPO).  If you have questions about this conversion, please contact the Pharmacy Department  []   [phone] )  ( [phone] []   [phone] )  [facility] []   [phone] )  [facility] []   [phone] )  [facility] [x]   [phone] )  [facility]   ( [phone], PharmD, BCPS 04/24/2021 10:17 AM

## 2021-04-24 NOTE — Progress Notes (Signed)
4 Days Post-Op  Subjective: CC: Doing well. Some soreness of his abdomen around his incision, otherwise no pain. Feels it is well controlled on current regimen. Was using IV pain medication up until yesterday AM. Tolerated liquids yesterday. No n/v. Passing flatus. BM yesterday. Mobilizing. No cp or sob  Objective: Vital signs in last 24 hours: Temp:  [97.5 F (36.4 C)-98.6 F (37 C)] 98.6 F (37 C) (10/17 0916) Pulse Rate:  [85-108] 85 (10/17 0916) Resp:  [17-18] 17 (10/17 0553) BP: (119-128)/(58-80) 127/80 (10/17 0916) SpO2:  [96 %-98 %] 97 % (10/17 0916) Last BM Date: 04/23/21  Intake/Output from previous day: 10/16 0701 - 10/17 0700 In: 2111.8 [P.O.:2000; I.V.:11.8; IV Piggyback:100] Out: 2 [Urine:2] Intake/Output this shift: No intake/output data recorded.  PE: Gen:  Alert, NAD, pleasant HEENT: EOM's intact, pupils equal and round Card:  RRR Pulm:  CTAB, no W/R/R, effort normal Abd: Soft, ND, NT +BS, laparoscopic incisions c/d/I. Midline wound with staples in place, c/d/I Ext:  No LE edema Psych: A&Ox3  Skin: no rashes noted, warm and dry  Lab Results:  Recent Labs    04/22/21 0547 04/23/21 0351  WBC 8.7 7.2  HGB 13.9 12.5*  HCT 41.8 38.0*  PLT 211 198   BMET Recent Labs    04/22/21 0547 04/23/21 0351  NA 136 136  K 4.3 3.9  CL 106 106  CO2 25 25  GLUCOSE 117* 98  BUN 22* 10  CREATININE 0.92 0.69  CALCIUM 8.1* 8.2*   PT/INR No results for input(s): LABPROT, INR in the last 72 hours. CMP     Component Value Date/Time   NA 136 04/23/2021 0351   NA 142 11/12/2018 0953   K 3.9 04/23/2021 0351   CL 106 04/23/2021 0351   CO2 25 04/23/2021 0351   GLUCOSE 98 04/23/2021 0351   BUN 10 04/23/2021 0351   BUN 13 11/12/2018 0953   CREATININE 0.69 04/23/2021 0351   CALCIUM 8.2 (L) 04/23/2021 0351   PROT 6.8 04/20/2021 1527   PROT 7.0 11/12/2018 0953   ALBUMIN 4.4 04/20/2021 1527   ALBUMIN 4.5 11/12/2018 0953   AST 15 04/20/2021 1527   ALT 10  04/20/2021 1527   ALKPHOS 68 04/20/2021 1527   BILITOT 0.6 04/20/2021 1527   BILITOT 0.4 11/12/2018 0953   GFRNONAA >60 04/23/2021 0351   GFRAA >60 04/11/2019 0928   Lipase     Component Value Date/Time   LIPASE 44 04/20/2021 1527    Studies/Results: No results found.  Anti-infectives: Anti-infectives (From admission, onward)    Start     Dose/Rate Route Frequency Ordered Stop   04/20/21 1745  piperacillin-tazobactam (ZOSYN) IVPB 3.375 g        3.375 g 100 mL/hr over 30 Minutes Intravenous  Once 04/20/21 1734 04/20/21 1824        Assessment/Plan -POD#4 S/p Diagnostic laparoscopy converted to exploratory laparotomy, Reduction of small bowel volvulus, Closure of Petersen's space hernia 10/13 Dr. Andrey Campanile for Small bowel volvulus due to Petersen's space hernia in the setting of prior laparoscopic Roux-en-Y gastric bypass 05/2015 at [facility] - Adv diet - Pain control - If tolerates diet and pain is well controlled, plan for d/c this PM.  - PPI per home regimen - Mobilize - Pulm toilet   ID - zosyn peri-op. None currently FEN - Bariatric advanced VTE - SCDs, Lovenox Foley - none  CP - resolved. Workup reassuring. Cleared by cards who has signed off.  LOS: 4 days    Jacinto Halim , [facility] 04/24/2021, 9:22 AM Please see Amion for pager number during day hours 7:00am-4:30pm

## 2021-04-25 MED FILL — Fentanyl Citrate Preservative Free (PF) Inj 100 MCG/2ML: INTRAMUSCULAR | Qty: 1 | Status: AC

## 2021-09-05 ENCOUNTER — Other Ambulatory Visit: Payer: Self-pay | Admitting: Orthopedic Surgery

## 2021-09-11 ENCOUNTER — Other Ambulatory Visit: Payer: Self-pay

## 2021-09-12 ENCOUNTER — Ambulatory Visit (INDEPENDENT_AMBULATORY_CARE_PROVIDER_SITE_OTHER): Payer: 59 | Admitting: Vascular Surgery

## 2021-09-12 ENCOUNTER — Other Ambulatory Visit: Payer: Self-pay

## 2021-09-12 ENCOUNTER — Encounter: Payer: Self-pay | Admitting: Vascular Surgery

## 2021-09-12 DIAGNOSIS — G8929 Other chronic pain: Secondary | ICD-10-CM

## 2021-09-12 DIAGNOSIS — M545 Low back pain, unspecified: Secondary | ICD-10-CM | POA: Diagnosis not present
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
## 2021-09-20 ENCOUNTER — Other Ambulatory Visit: Payer: Self-pay

## 2021-10-03 NOTE — Pre-Procedure Instructions (Signed)
Surgical Instructions ? ? ? Your procedure is scheduled on Wednesday, April 5th. ? Report to [facility] Main Entrance "A" at 05:30 A.M., then check in with the Admitting office. ? Call this number if you have problems the morning of surgery: ? [phone] ? ? If you have any questions prior to your surgery date call [phone]: Open Monday-Friday 8am-4pm ? ? ? Remember: ? Do not eat after midnight the night before your surgery ? ?You may drink clear liquids until 05:30 AM the morning of your surgery.   ?Clear liquids allowed are: Water, Non-Citrus Juices (without pulp), Carbonated Beverages, Clear Tea, Black Coffee Only (NO MILK, CREAM OR POWDERED CREAMER of any kind), and Gatorade. ? ? ?Patient Instructions ? ?The night before surgery:  ?No food after midnight. ONLY clear liquids after midnight ? ?The day of surgery (if you do NOT have diabetes):  ?Drink ONE (1) Pre-Surgery Clear Ensure by 05:30 AM the morning of surgery. Drink in one sitting. Do not sip.  ?This drink was given to you during your hospital  ?pre-op appointment visit. ? ?Nothing else to drink after completing the  ?Pre-Surgery Clear Ensure. ? ? ?       If you have questions, please contact your surgeon?s office.  ? ?  ? Take these medicines the morning of surgery with A SIP OF WATER  ?acetaminophen (TYLENOL)- if needed ?lansoprazole (PREVACID)- if needed ? ? ? ?As of today, STOP taking any Aspirin (unless otherwise instructed by your surgeon) Aleve, Naproxen, Ibuprofen, Motrin, Advil, Goody's, BC's, all herbal medications, fish oil, and all vitamins. ? ? ?WHAT DO I DO ABOUT MY DIABETES MEDICATION? ? ? ?Do not take metFORMIN (GLUCOPHAGE) the morning of surgery. ? ? ? ?HOW TO MANAGE YOUR DIABETES ?BEFORE AND AFTER SURGERY ? ?Why is it important to control my blood sugar before and after surgery? ?Improving blood sugar levels before and after surgery helps healing and can limit problems. ?A way of improving blood sugar control is eating a healthy  diet by: ? Eating less sugar and carbohydrates ? Increasing activity/exercise ? Talking with your doctor about reaching your blood sugar goals ?High blood sugars (greater than 180 mg/dL) can raise your risk of infections and slow your recovery, so you will need to focus on controlling your diabetes during the weeks before surgery. ?Make sure that the doctor who takes care of your diabetes knows about your planned surgery including the date and location. ? ?How do I manage my blood sugar before surgery? ?Check your blood sugar at least 4 times a day, starting 2 days before surgery, to make sure that the level is not too high or low. ? ?Check your blood sugar the morning of your surgery when you wake up and every 2 hours until you get to the Short Stay unit. ? ?If your blood sugar is less than 70 mg/dL, you will need to treat for low blood sugar: ?Do not take insulin. ?Treat a low blood sugar (less than 70 mg/dL) with ? cup of clear juice (cranberry or apple), 4 glucose tablets, OR glucose gel. ?Recheck blood sugar in 15 minutes after treatment (to make sure it is greater than 70 mg/dL). If your blood sugar is not greater than 70 mg/dL on recheck, call [phone] for further instructions. ?Report your blood sugar to the short stay nurse when you get to Short Stay. ? ?If you are admitted to the hospital after surgery: ?Your blood sugar will be checked by the staff and you  will probably be given insulin after surgery (instead of oral diabetes medicines) to make sure you have good blood sugar levels. ?The goal for blood sugar control after surgery is 80-180 mg/dL.         ?           ?Do NOT Smoke (Tobacco/Vaping) for 24 hours prior to your procedure. ? ?If you use a CPAP at night, you may bring your mask/headgear for your overnight stay. ?  ?Contacts, glasses, piercing's, hearing aid's, dentures or partials may not be worn into surgery, please bring cases for these belongings.  ?  ?For patients admitted to the  hospital, discharge time will be determined by your treatment team. ?  ?Patients discharged the day of surgery will not be allowed to drive home, and someone needs to stay with them for 24 hours. ? ?SURGICAL WAITING ROOM VISITATION ?Patients having surgery or a procedure may have two support people in the waiting room. These visitors may be switched out with other visitors if needed. ?Children under the age of 59 must have an adult accompany them who is not the patient. ?If the patient needs to stay at the hospital during part of their recovery, the visitor guidelines for inpatient rooms apply. ? ?Please refer to the [place] website for the visitor guidelines for Inpatients (after your surgery is over and you are in a regular room).  ? ? ?Special instructions:   ?[place]- Preparing For Surgery ? ?Before surgery, you can play an important role. Because skin is not sterile, your skin needs to be as free of germs as possible. You can reduce the number of germs on your skin by washing with CHG (chlorahexidine gluconate) Soap before surgery.  CHG is an antiseptic cleaner which kills germs and bonds with the skin to continue killing germs even after washing.   ? ?Oral Hygiene is also important to reduce your risk of infection.  Remember - BRUSH YOUR TEETH THE MORNING OF SURGERY WITH YOUR REGULAR TOOTHPASTE ? ?Please do not use if you have an allergy to CHG or antibacterial soaps. If your skin becomes reddened/irritated stop using the CHG.  ?Do not shave (including legs and underarms) for at least 48 hours prior to first CHG shower. It is OK to shave your face. ? ?Please follow these instructions carefully. ?  ?Shower the NIGHT BEFORE SURGERY and the MORNING OF SURGERY ? ?If you chose to wash your hair, wash your hair first as usual with your normal shampoo. ? ?After you shampoo, rinse your hair and body thoroughly to remove the shampoo. ? ?Use CHG Soap as you would any other liquid soap. You can apply CHG directly  to the skin and wash gently with a scrungie or a clean washcloth.  ? ?Apply the CHG Soap to your body ONLY FROM THE NECK DOWN.  Do not use on open wounds or open sores. Avoid contact with your eyes, ears, mouth and genitals (private parts). Wash Face and genitals (private parts)  with your normal soap.  ? ?Wash thoroughly, paying special attention to the area where your surgery will be performed. ? ?Thoroughly rinse your body with warm water from the neck down. ? ?DO NOT shower/wash with your normal soap after using and rinsing off the CHG Soap. ? ?Pat yourself dry with a CLEAN TOWEL. ? ?Wear CLEAN PAJAMAS to bed the night before surgery ? ?Place CLEAN SHEETS on your bed the night before your surgery ? ?DO NOT SLEEP WITH PETS. ? ? ?  Day of Surgery: ?Take a shower with CHG soap. ?Do not wear jewelry  ?Do not wear lotions, powders, colognes, or deodorant. ?Do not shave 48 hours prior to surgery.  Men may shave face and neck. ?Do not bring valuables to the hospital.  ? is not responsible for any belongings or valuables. ? ?Wear Clean/Comfortable clothing the morning of surgery ?Remember to brush your teeth WITH YOUR REGULAR TOOTHPASTE. ?  ?Please read over the following fact sheets that you were given. ? ? ? ?If you received a COVID test during your pre-op visit  it is requested that you wear a mask when out in public, stay away from anyone that may not be feeling well and notify your surgeon if you develop symptoms. If you have been in contact with anyone that has tested positive in the last 10 days please notify you surgeon.  ?

## 2021-10-04 ENCOUNTER — Other Ambulatory Visit: Payer: Self-pay

## 2021-10-04 ENCOUNTER — Other Ambulatory Visit (HOSPITAL_COMMUNITY): Payer: 59

## 2021-10-04 ENCOUNTER — Encounter (HOSPITAL_COMMUNITY)
Admission: RE | Admit: 2021-10-04 | Discharge: 2021-10-04 | Disposition: A | Discharge status: Home / Self Care | Payer: 59 | Source: Ambulatory Visit | Attending: Orthopedic Surgery | Admitting: Orthopedic Surgery

## 2021-10-04 ENCOUNTER — Encounter (HOSPITAL_COMMUNITY): Payer: Self-pay

## 2021-10-04 DIAGNOSIS — Z01818 Encounter for other preprocedural examination: Secondary | ICD-10-CM

## 2021-10-04 DIAGNOSIS — Z01812 Encounter for preprocedural laboratory examination: Secondary | ICD-10-CM | POA: Diagnosis present

## 2021-10-04 HISTORY — DX: Anxiety disorder, unspecified: F41.9

## 2021-10-04 HISTORY — DX: Methicillin resistant Staphylococcus aureus infection, unspecified site: A49.02

## 2021-10-04 HISTORY — DX: Attention-deficit hyperactivity disorder, unspecified type: F90.9

## 2021-10-04 HISTORY — DX: Gastro-esophageal reflux disease without esophagitis: K21.9

## 2021-10-04 LAB — SURGICAL PCR SCREEN
MRSA, PCR: NEGATIVE
Staphylococcus aureus: POSITIVE — AB

## 2021-10-04 LAB — CBC
HCT: 46.2 % (ref 39.0–52.0)
Hemoglobin: 15 g/dL (ref 13.0–17.0)
MCH: 29.9 pg (ref 26.0–34.0)
MCHC: 32.5 g/dL (ref 30.0–36.0)
MCV: 92.2 fL (ref 80.0–100.0)
Platelets: 279 10*3/uL (ref 150–400)
RBC: 5.01 MIL/uL (ref 4.22–5.81)
RDW: 14 % (ref 11.5–15.5)
WBC: 6.2 10*3/uL (ref 4.0–10.5)
nRBC: 0 % (ref 0.0–0.2)

## 2021-10-04 LAB — TYPE AND SCREEN
ABO/RH(D): B POS
Antibody Screen: NEGATIVE

## 2021-10-04 LAB — BASIC METABOLIC PANEL
Anion gap: 6 (ref 5–15)
BUN: 15 mg/dL (ref 6–20)
CO2: 27 mmol/L (ref 22–32)
Calcium: 9.9 mg/dL (ref 8.9–10.3)
Chloride: 105 mmol/L (ref 98–111)
Creatinine, Ser: 0.72 mg/dL (ref 0.61–1.24)
GFR, Estimated: >60 mL/min (ref >60)
Glucose, Bld: 91 mg/dL (ref 70–99)
Potassium: 3.6 mmol/L (ref 3.5–5.1)
Sodium: 138 mmol/L (ref 135–145)

## 2021-10-04 NOTE — Pre-Procedure Instructions (Signed)
Surgical Instructions ? ? ? Your procedure is scheduled on Wednesday, April 5th. ? Report to [facility] Main Entrance "A" at 05:30 A.M., then check in with the Admitting office. ? Call this number if you have problems the morning of surgery: ? [phone] ? ? If you have any questions prior to your surgery date call [phone]: Open Monday-Friday 8am-4pm ? ? ? Remember: ? Do not eat after midnight the night before your surgery ? ?You may drink clear liquids until 05:30 AM the morning of your surgery.   ?Clear liquids allowed are: Water, Non-Citrus Juices (without pulp), Carbonated Beverages, Clear Tea, Black Coffee Only (NO MILK, CREAM OR POWDERED CREAMER of any kind), and Gatorade. ? ? ?Patient Instructions ? ?The night before surgery:  ?No food after midnight. ONLY clear liquids after midnight ? ?The day of surgery (if you do NOT have diabetes):  ?Drink ONE (1) Pre-Surgery Clear Ensure by 05:30 AM the morning of surgery. Drink in one sitting. Do not sip.  ?This drink was given to you during your hospital  ?pre-op appointment visit. ? ?Nothing else to drink after completing the  ?Pre-Surgery Clear Ensure. ? ? ?       If you have questions, please contact your surgeon?s office.  ? ?  ? Take these medicines the morning of surgery with A SIP OF WATER  ?acetaminophen (TYLENOL)- if needed ?lansoprazole (PREVACID)- if needed ? ?Do not take metFORMIN (GLUCOPHAGE) the morning of surgery. ? ?As of today, STOP taking any Aspirin (unless otherwise instructed by your surgeon) Aleve, Naproxen, Ibuprofen, Motrin, Advil, Goody's, BC's, all herbal medications, fish oil, and all vitamins. ? ? ?       ?           ?Do NOT Smoke (Tobacco/Vaping) for 24 hours prior to your procedure. ? ?If you use a CPAP at night, you may bring your mask/headgear for your overnight stay. ?  ?Contacts, glasses, piercing's, hearing aid's, dentures or partials may not be worn into surgery, please bring cases for these belongings.  ?  ?For patients  admitted to the hospital, discharge time will be determined by your treatment team. ?  ?Patients discharged the day of surgery will not be allowed to drive home, and someone needs to stay with them for 24 hours. ? ?SURGICAL WAITING ROOM VISITATION ?Patients having surgery or a procedure may have two support people in the waiting room. These visitors may be switched out with other visitors if needed. ?Children under the age of 75 must have an adult accompany them who is not the patient. ?If the patient needs to stay at the hospital during part of their recovery, the visitor guidelines for inpatient rooms apply. ? ?Please refer to the [facility] website for the visitor guidelines for Inpatients (after your surgery is over and you are in a regular room).  ? ? ?Special instructions:   ?[facility]- Preparing For Surgery ? ?Before surgery, you can play an important role. Because skin is not sterile, your skin needs to be as free of germs as possible. You can reduce the number of germs on your skin by washing with CHG (chlorahexidine gluconate) Soap before surgery.  CHG is an antiseptic cleaner which kills germs and bonds with the skin to continue killing germs even after washing.   ? ?Oral Hygiene is also important to reduce your risk of infection.  Remember - BRUSH YOUR TEETH THE MORNING OF SURGERY WITH YOUR REGULAR TOOTHPASTE ? ?Please do not use if you  have an allergy to CHG or antibacterial soaps. If your skin becomes reddened/irritated stop using the CHG.  ?Do not shave (including legs and underarms) for at least 48 hours prior to first CHG shower. It is OK to shave your face. ? ?Please follow these instructions carefully. ?  ?Shower the NIGHT BEFORE SURGERY and the MORNING OF SURGERY ? ?If you chose to wash your hair, wash your hair first as usual with your normal shampoo. ? ?After you shampoo, rinse your hair and body thoroughly to remove the shampoo. ? ?Use CHG Soap as you would any other liquid soap. You can  apply CHG directly to the skin and wash gently with a scrungie or a clean washcloth.  ? ?Apply the CHG Soap to your body ONLY FROM THE NECK DOWN.  Do not use on open wounds or open sores. Avoid contact with your eyes, ears, mouth and genitals (private parts). Wash Face and genitals (private parts)  with your normal soap.  ? ?Wash thoroughly, paying special attention to the area where your surgery will be performed. ? ?Thoroughly rinse your body with warm water from the neck down. ? ?DO NOT shower/wash with your normal soap after using and rinsing off the CHG Soap. ? ?Pat yourself dry with a CLEAN TOWEL. ? ?Wear CLEAN PAJAMAS to bed the night before surgery ? ?Place CLEAN SHEETS on your bed the night before your surgery ? ?DO NOT SLEEP WITH PETS. ? ? ?Day of Surgery: ?Take a shower with CHG soap. ?Do not wear jewelry  ?Do not wear lotions, powders, colognes, or deodorant. ?Do not shave 48 hours prior to surgery.  Men may shave face and neck. ?Do not bring valuables to the hospital.  ?[facility] is not responsible for any belongings or valuables. ? ?Wear Clean/Comfortable clothing the morning of surgery ?Remember to brush your teeth WITH YOUR REGULAR TOOTHPASTE. ?  ?Please read over the following fact sheets that you were given. ? ? ? ?If you received a COVID test during your pre-op visit  it is requested that you wear a mask when out in public, stay away from anyone that may not be feeling well and notify your surgeon if you develop symptoms. If you have been in contact with anyone that has tested positive in the last 10 days please notify you surgeon.  ?

## 2021-10-04 NOTE — Progress Notes (Signed)
PCP - RIchard Tisovec ?Cardiologist - denies ? ?PPM/ICD - denies ?Chest x-ray - N/A ?EKG - 04/21/21- per Fayrene Fearing, do not need to repeat today ?Stress Test - denies ?ECHO - denies ?Cardiac Cath - denies ? ?Sleep Study - denies ? ?Aspirin Instructions: pt does not take blood thinners ? ?ERAS Protcol - ERAS order with drink- Ensure pre-surgery drink given to patient at PAT appointment ? ?COVID TEST- N/A ? ?Pt states he takes Metformin for weight loss as well as preventative for aging. Pt denies Diabetes or heart issues.  ? ?Anesthesia review: spoke with Fayrene Fearing, [place] about repeating EKG d/t previous EKG- per Fayrene Fearing this is not indicated at this time. Pt has denied any cardiac issues. Per Fayrene Fearing- do BMET in PAT today d/t history of gastric bypass surgery.  ? ?Patient denies shortness of breath, fever, cough and chest pain at PAT appointment ? ? ?All instructions explained to the patient, with a verbal understanding of the material. Patient agrees to go over the instructions while at home for a better understanding. Patient also instructed to self quarantine after being tested for COVID-19. The opportunity to ask questions was provided. ? ? ?

## 2021-10-10 NOTE — Anesthesia Preprocedure Evaluation (Addendum)
Anesthesia Evaluation  ?Patient identified by MRN, date of birth, ID band ?Patient awake ? ? ? ?Reviewed: ?Allergy & Precautions, NPO status , Patient's Chart, lab work & pertinent test results ? ?Airway ?Mallampati: II ? ?TM Distance: >3 FB ? ? ? ? Dental ?  ?Pulmonary ?neg pulmonary ROS,  ?  ?breath sounds clear to auscultation ? ? ? ? ? ? Cardiovascular ?negative cardio ROS ? ? ?Rhythm:Regular Rate:Normal ? ? ?  ?Neuro/Psych ?PSYCHIATRIC DISORDERS negative neurological ROS ?   ? GI/Hepatic ?Neg liver ROS, GERD  ,  ?Endo/Other  ? ? Renal/GU ?negative Renal ROS  ? ?  ?Musculoskeletal ? ? Abdominal ?  ?Peds ? Hematology ?  ?Anesthesia Other Findings ? ? Reproductive/Obstetrics ? ?  ? ? ? ? ? ? ? ? ? ? ? ? ? ?  ?  ? ? ? ? ? ? ? ?Anesthesia Physical ?Anesthesia Plan ? ?ASA: 2 ? ?Anesthesia Plan: General  ? ?Post-op Pain Management:   ? ?Induction: Intravenous ? ?PONV Risk Score and Plan: 2 and Ondansetron, Dexamethasone and Midazolam ? ?Airway Management Planned: Oral ETT ? ?Additional Equipment:  ? ?Intra-op Plan:  ? ?Post-operative Plan: Extubation in OR ? ?Informed Consent: I have reviewed the patients History and Physical, chart, labs and discussed the procedure including the risks, benefits and alternatives for the proposed anesthesia with the patient or authorized representative who has indicated his/her understanding and acceptance.  ? ? ? ?Dental advisory given ? ?Plan Discussed with: Anesthesiologist and CRNA ? ?Anesthesia Plan Comments:   ? ? ? ? ? ?Anesthesia Quick Evaluation ? ?

## 2021-10-11 ENCOUNTER — Encounter (HOSPITAL_COMMUNITY)
Admission: RE | Disposition: A | Discharge status: Home / Self Care | Payer: Self-pay | Source: Home / Self Care | Attending: Orthopedic Surgery

## 2021-10-11 ENCOUNTER — Other Ambulatory Visit: Payer: Self-pay

## 2021-10-11 ENCOUNTER — Inpatient Hospital Stay (HOSPITAL_COMMUNITY): Payer: 59

## 2021-10-11 ENCOUNTER — Inpatient Hospital Stay (HOSPITAL_COMMUNITY)
Admission: RE | Admit: 2021-10-11 | Discharge: 2021-10-12 | DRG: 460 | Disposition: A | Discharge status: Home / Self Care | Payer: 59 | Attending: Orthopedic Surgery | Admitting: Orthopedic Surgery

## 2021-10-11 ENCOUNTER — Inpatient Hospital Stay (HOSPITAL_COMMUNITY): Payer: 59 | Admitting: Physician Assistant

## 2021-10-11 ENCOUNTER — Encounter (HOSPITAL_COMMUNITY): Payer: Self-pay | Admitting: Orthopedic Surgery

## 2021-10-11 DIAGNOSIS — F909 Attention-deficit hyperactivity disorder, unspecified type: Secondary | ICD-10-CM | POA: Diagnosis present

## 2021-10-11 DIAGNOSIS — F419 Anxiety disorder, unspecified: Secondary | ICD-10-CM | POA: Diagnosis present

## 2021-10-11 DIAGNOSIS — M5136 Other intervertebral disc degeneration, lumbar region: Secondary | ICD-10-CM

## 2021-10-11 DIAGNOSIS — M5116 Intervertebral disc disorders with radiculopathy, lumbar region: Principal | ICD-10-CM | POA: Diagnosis present

## 2021-10-11 DIAGNOSIS — Z886 Allergy status to analgesic agent status: Secondary | ICD-10-CM

## 2021-10-11 DIAGNOSIS — Z7984 Long term (current) use of oral hypoglycemic drugs: Secondary | ICD-10-CM | POA: Diagnosis not present

## 2021-10-11 DIAGNOSIS — Z9884 Bariatric surgery status: Secondary | ICD-10-CM | POA: Diagnosis not present

## 2021-10-11 DIAGNOSIS — M5410 Radiculopathy, site unspecified: Secondary | ICD-10-CM | POA: Diagnosis present

## 2021-10-11 DIAGNOSIS — Z885 Allergy status to narcotic agent status: Secondary | ICD-10-CM | POA: Diagnosis not present

## 2021-10-11 DIAGNOSIS — Z79899 Other long term (current) drug therapy: Secondary | ICD-10-CM | POA: Diagnosis not present

## 2021-10-11 HISTORY — PX: ABDOMINAL EXPOSURE: SHX5708

## 2021-10-11 HISTORY — PX: ANTERIOR LUMBAR DISC ARTHROPLASTY: SHX5721

## 2021-10-11 SURGERY — ANTERIOR LUMBAR DISC ARTHROPLASTY
Anesthesia: General | Site: Abdomen

## 2021-10-11 MED ORDER — ONDANSETRON HCL 4 MG/2ML IJ SOLN: 4.0000 mg | Freq: Four times a day (QID) | INTRAMUSCULAR | Status: DC | PRN

## 2021-10-11 MED ORDER — CEFAZOLIN SODIUM-DEXTROSE 2-4 GM/100ML-% IV SOLN
2.0000 g | INTRAVENOUS | Status: AC
  Administered 2021-10-11: 2 g via INTRAVENOUS
  Filled 2021-10-11: qty 100

## 2021-10-11 MED ORDER — ROCURONIUM BROMIDE 10 MG/ML (PF) SYRINGE
PREFILLED_SYRINGE | INTRAVENOUS | Status: DC | PRN
  Administered 2021-10-11: 50 mg via INTRAVENOUS
  Administered 2021-10-11 (×3): 40 mg via INTRAVENOUS
  Administered 2021-10-11: 30 mg via INTRAVENOUS
  Administered 2021-10-11: 40 mg via INTRAVENOUS
  Administered 2021-10-11: 60 mg via INTRAVENOUS
  Administered 2021-10-11: 30 mg via INTRAVENOUS

## 2021-10-11 MED ORDER — ROCURONIUM BROMIDE 10 MG/ML (PF) SYRINGE
PREFILLED_SYRINGE | INTRAVENOUS | Status: AC
  Filled 2021-10-11: qty 10

## 2021-10-11 MED ORDER — ONDANSETRON 4 MG PO TBDP: 4.0000 mg | ORAL_TABLET | Freq: Four times a day (QID) | ORAL | Status: DC | PRN

## 2021-10-11 MED ORDER — PHENYLEPHRINE HCL-NACL 20-0.9 MG/250ML-% IV SOLN
INTRAVENOUS | Status: DC | PRN
  Administered 2021-10-11: 50 ug/min via INTRAVENOUS

## 2021-10-11 MED ORDER — POVIDONE-IODINE 7.5 % EX SOLN
Freq: Once | CUTANEOUS | Status: DC
Start: 2021-10-11 — End: 2021-10-11
  Filled 2021-10-11: qty 118

## 2021-10-11 MED ORDER — METHOCARBAMOL 1000 MG/10ML IJ SOLN
500.0000 mg | Freq: Four times a day (QID) | INTRAVENOUS | Status: DC | PRN
  Filled 2021-10-11: qty 5

## 2021-10-11 MED ORDER — BUPIVACAINE LIPOSOME 1.3 % IJ SUSP
INTRAMUSCULAR | Status: AC
  Filled 2021-10-11: qty 20

## 2021-10-11 MED ORDER — FLEET ENEMA 7-19 GM/118ML RE ENEM: 1.0000 | ENEMA | Freq: Once | RECTAL | Status: DC | PRN

## 2021-10-11 MED ORDER — MENTHOL 3 MG MT LOZG: 1.0000 | LOZENGE | OROMUCOSAL | Status: DC | PRN

## 2021-10-11 MED ORDER — ADULT MULTIVITAMIN W/MINERALS CH
1.0000 | ORAL_TABLET | Freq: Every day | ORAL | Status: DC
  Administered 2021-10-11 – 2021-10-12 (×2): 1 via ORAL
  Filled 2021-10-11 (×2): qty 1

## 2021-10-11 MED ORDER — LIDOCAINE 2% (20 MG/ML) 5 ML SYRINGE
INTRAMUSCULAR | Status: DC | PRN
  Administered 2021-10-11: 40 mg via INTRAVENOUS

## 2021-10-11 MED ORDER — MIDAZOLAM HCL 2 MG/2ML IJ SOLN
INTRAMUSCULAR | Status: DC | PRN
Start: 2021-10-11 — End: 2021-10-11
  Administered 2021-10-11: 2 mg via INTRAVENOUS

## 2021-10-11 MED ORDER — ORAL CARE MOUTH RINSE: 15.0000 mL | Freq: Once | OROMUCOSAL | Status: AC

## 2021-10-11 MED ORDER — HEMOSTATIC AGENTS (NO CHARGE) OPTIME
TOPICAL | Status: DC | PRN
  Administered 2021-10-11: 1 via TOPICAL

## 2021-10-11 MED ORDER — HYDROMORPHONE HCL 1 MG/ML IJ SOLN
0.2500 mg | INTRAMUSCULAR | Status: DC | PRN
  Administered 2021-10-11 (×2): 0.25 mg via INTRAVENOUS

## 2021-10-11 MED ORDER — SENNOSIDES-DOCUSATE SODIUM 8.6-50 MG PO TABS: 1.0000 | ORAL_TABLET | Freq: Every evening | ORAL | Status: DC | PRN

## 2021-10-11 MED ORDER — THROMBIN 20000 UNITS EX SOLR
CUTANEOUS | Status: AC
  Filled 2021-10-11: qty 20000

## 2021-10-11 MED ORDER — SUGAMMADEX SODIUM 200 MG/2ML IV SOLN
INTRAVENOUS | Status: DC | PRN
  Administered 2021-10-11: 200 mg via INTRAVENOUS

## 2021-10-11 MED ORDER — POTASSIUM CHLORIDE IN NACL 20-0.9 MEQ/L-% IV SOLN: INTRAVENOUS | Status: DC

## 2021-10-11 MED ORDER — LACTATED RINGERS IV SOLN
INTRAVENOUS | Status: DC
Start: 2021-10-11 — End: 2021-10-11

## 2021-10-11 MED ORDER — PROPOFOL 10 MG/ML IV BOLUS
INTRAVENOUS | Status: AC
  Filled 2021-10-11: qty 20

## 2021-10-11 MED ORDER — BUPIVACAINE-EPINEPHRINE (PF) 0.25% -1:200000 IJ SOLN
INTRAMUSCULAR | Status: AC
  Filled 2021-10-11: qty 30

## 2021-10-11 MED ORDER — PROPOFOL 10 MG/ML IV BOLUS
INTRAVENOUS | Status: DC | PRN
  Administered 2021-10-11: 200 mg via INTRAVENOUS

## 2021-10-11 MED ORDER — FENTANYL CITRATE (PF) 250 MCG/5ML IJ SOLN
INTRAMUSCULAR | Status: DC | PRN
  Administered 2021-10-11 (×2): 100 ug via INTRAVENOUS
  Administered 2021-10-11: 50 ug via INTRAVENOUS
  Administered 2021-10-11: 100 ug via INTRAVENOUS
  Administered 2021-10-11 (×2): 50 ug via INTRAVENOUS

## 2021-10-11 MED ORDER — HYDROMORPHONE HCL 1 MG/ML IJ SOLN
INTRAMUSCULAR | Status: AC
  Filled 2021-10-11: qty 1

## 2021-10-11 MED ORDER — SODIUM CHLORIDE 0.9% FLUSH: 3.0000 mL | INTRAVENOUS | Status: DC | PRN

## 2021-10-11 MED ORDER — OXYCODONE-ACETAMINOPHEN 5-325 MG PO TABS
1.0000 | ORAL_TABLET | ORAL | Status: DC | PRN
  Administered 2021-10-11 – 2021-10-12 (×6): 2 via ORAL
  Filled 2021-10-11 (×6): qty 2

## 2021-10-11 MED ORDER — ONDANSETRON HCL 4 MG/2ML IJ SOLN
INTRAMUSCULAR | Status: DC | PRN
  Administered 2021-10-11: 4 mg via INTRAVENOUS

## 2021-10-11 MED ORDER — LACTATED RINGERS IV SOLN: INTRAVENOUS | Status: DC | PRN

## 2021-10-11 MED ORDER — ACETAMINOPHEN 650 MG RE SUPP: 650.0000 mg | RECTAL | Status: DC | PRN

## 2021-10-11 MED ORDER — ALUM & MAG HYDROXIDE-SIMETH 200-200-20 MG/5ML PO SUSP: 30.0000 mL | Freq: Four times a day (QID) | ORAL | Status: DC | PRN

## 2021-10-11 MED ORDER — PHENOL 1.4 % MT LIQD: 1.0000 | OROMUCOSAL | Status: DC | PRN

## 2021-10-11 MED ORDER — THROMBIN 20000 UNITS EX SOLR
CUTANEOUS | Status: DC | PRN
  Administered 2021-10-11: 20000 [IU] via TOPICAL

## 2021-10-11 MED ORDER — SODIUM CHLORIDE 0.9% FLUSH
3.0000 mL | Freq: Two times a day (BID) | INTRAVENOUS | Status: DC
  Administered 2021-10-11 (×2): 3 mL via INTRAVENOUS

## 2021-10-11 MED ORDER — CEFAZOLIN SODIUM-DEXTROSE 2-4 GM/100ML-% IV SOLN
2.0000 g | Freq: Three times a day (TID) | INTRAVENOUS | Status: AC
  Administered 2021-10-11 – 2021-10-12 (×2): 2 g via INTRAVENOUS
  Filled 2021-10-11 (×2): qty 100

## 2021-10-11 MED ORDER — CHLORHEXIDINE GLUCONATE 0.12 % MT SOLN
15.0000 mL | Freq: Once | OROMUCOSAL | Status: AC
  Administered 2021-10-11: 15 mL via OROMUCOSAL
  Filled 2021-10-11: qty 15

## 2021-10-11 MED ORDER — ONDANSETRON HCL 4 MG/2ML IJ SOLN
INTRAMUSCULAR | Status: AC
  Filled 2021-10-11: qty 2

## 2021-10-11 MED ORDER — FENTANYL CITRATE (PF) 250 MCG/5ML IJ SOLN
INTRAMUSCULAR | Status: AC
  Filled 2021-10-11: qty 5

## 2021-10-11 MED ORDER — METFORMIN HCL 500 MG PO TABS
500.0000 mg | ORAL_TABLET | Freq: Every day | ORAL | Status: DC
  Administered 2021-10-12: 500 mg via ORAL
  Filled 2021-10-11: qty 1

## 2021-10-11 MED ORDER — ONDANSETRON HCL 4 MG PO TABS: 4.0000 mg | ORAL_TABLET | Freq: Four times a day (QID) | ORAL | Status: DC | PRN

## 2021-10-11 MED ORDER — MORPHINE SULFATE (PF) 2 MG/ML IV SOLN: 1.0000 mg | INTRAVENOUS | Status: DC | PRN

## 2021-10-11 MED ORDER — DOCUSATE SODIUM 100 MG PO CAPS
100.0000 mg | ORAL_CAPSULE | Freq: Two times a day (BID) | ORAL | Status: DC
  Administered 2021-10-11 – 2021-10-12 (×2): 100 mg via ORAL
  Filled 2021-10-11 (×2): qty 1

## 2021-10-11 MED ORDER — PANTOPRAZOLE SODIUM 20 MG PO TBEC
20.0000 mg | DELAYED_RELEASE_TABLET | Freq: Every day | ORAL | Status: DC
  Administered 2021-10-12: 20 mg via ORAL
  Filled 2021-10-11: qty 1

## 2021-10-11 MED ORDER — CHLORHEXIDINE GLUCONATE CLOTH 2 % EX PADS
6.0000 | MEDICATED_PAD | Freq: Once | CUTANEOUS | Status: DC
Start: 2021-10-11 — End: 2021-10-11

## 2021-10-11 MED ORDER — ZOLPIDEM TARTRATE 5 MG PO TABS: 5.0000 mg | ORAL_TABLET | Freq: Every evening | ORAL | Status: DC | PRN

## 2021-10-11 MED ORDER — MIDAZOLAM HCL 2 MG/2ML IJ SOLN
INTRAMUSCULAR | Status: AC
  Filled 2021-10-11: qty 2

## 2021-10-11 MED ORDER — HYDROCODONE-ACETAMINOPHEN 5-325 MG PO TABS: 1.0000 | ORAL_TABLET | ORAL | Status: DC | PRN

## 2021-10-11 MED ORDER — SUCCINYLCHOLINE CHLORIDE 200 MG/10ML IV SOSY
PREFILLED_SYRINGE | INTRAVENOUS | Status: AC
  Filled 2021-10-11: qty 10

## 2021-10-11 MED ORDER — DEXAMETHASONE SODIUM PHOSPHATE 10 MG/ML IJ SOLN
INTRAMUSCULAR | Status: AC
  Filled 2021-10-11: qty 1

## 2021-10-11 MED ORDER — SODIUM CHLORIDE 0.9 % IV SOLN
250.0000 mL | INTRAVENOUS | Status: DC
  Administered 2021-10-11: 250 mL via INTRAVENOUS

## 2021-10-11 MED ORDER — CHLORHEXIDINE GLUCONATE CLOTH 2 % EX PADS: 6.0000 | MEDICATED_PAD | Freq: Once | CUTANEOUS | Status: DC

## 2021-10-11 MED ORDER — LIDOCAINE 2% (20 MG/ML) 5 ML SYRINGE
INTRAMUSCULAR | Status: AC
  Filled 2021-10-11: qty 5

## 2021-10-11 MED ORDER — ACETAMINOPHEN 325 MG PO TABS: 650.0000 mg | ORAL_TABLET | ORAL | Status: DC | PRN

## 2021-10-11 MED ORDER — METHOCARBAMOL 500 MG PO TABS
500.0000 mg | ORAL_TABLET | Freq: Four times a day (QID) | ORAL | Status: DC | PRN
  Administered 2021-10-11 – 2021-10-12 (×3): 500 mg via ORAL
  Filled 2021-10-11 (×3): qty 1

## 2021-10-11 MED ORDER — CALCIUM CITRATE 950 (200 CA) MG PO TABS: 200.0000 mg | ORAL_TABLET | Freq: Every day | ORAL | Status: DC

## 2021-10-11 MED ORDER — BISACODYL 5 MG PO TBEC
5.0000 mg | DELAYED_RELEASE_TABLET | Freq: Every day | ORAL | Status: DC | PRN
  Administered 2021-10-11: 5 mg via ORAL
  Filled 2021-10-11: qty 1

## 2021-10-11 SURGICAL SUPPLY — 83 items
APL SKNCLS STERI-STRIP NONHPOA (GAUZE/BANDAGES/DRESSINGS) ×2
APPLIER CLIP 11 MED OPEN (CLIP) ×6
APR CLP MED 11 20 MLT OPN (CLIP) ×4
BAG COUNTER SPONGE SURGICOUNT (BAG) ×6 IMPLANT
BAG SPNG CNTER NS LX DISP (BAG) ×4
BENZOIN TINCTURE PRP APPL 2/3 (GAUZE/BANDAGES/DRESSINGS) ×1 IMPLANT
BLADE SURG 10 STRL SS (BLADE) ×3 IMPLANT
CLIP APPLIE 11 MED OPEN (CLIP) ×2 IMPLANT
CLIP LIGATING EXTRA MED SLVR (CLIP) ×2 IMPLANT
CORD BIPOLAR FORCEPS 12FT (ELECTRODE) ×3 IMPLANT
COVER SURGICAL LIGHT HANDLE (MISCELLANEOUS) ×6 IMPLANT
DRAPE C-ARM 35X43 STRL (DRAPES) ×1 IMPLANT
DRAPE C-ARM 42X72 X-RAY (DRAPES) ×6 IMPLANT
DRAPE HALF SHEET 40X57 (DRAPES) ×9 IMPLANT
DRAPE SURG 17X23 STRL (DRAPES) ×12 IMPLANT
DRAPE U-SHAPE 47X51 STRL (DRAPES) ×3 IMPLANT
DRSG MEPILEX BORDER 4X8 (GAUZE/BANDAGES/DRESSINGS) ×2 IMPLANT
DURAPREP 26ML APPLICATOR (WOUND CARE) ×3 IMPLANT
ELECT BLADE 4.0 EZ CLEAN MEGAD (MISCELLANEOUS) ×6
ELECT CAUTERY BLADE 6.4 (BLADE) ×3 IMPLANT
ELECT REM PT RETURN 9FT ADLT (ELECTROSURGICAL) ×3
ELECTRODE BLDE 4.0 EZ CLN MEGD (MISCELLANEOUS) ×4 IMPLANT
ELECTRODE REM PT RTRN 9FT ADLT (ELECTROSURGICAL) ×2 IMPLANT
ENDPLATE INFER PRODISC LG 3D (Neuro Prosthesis/Implant) ×1 IMPLANT
ENDPLATE SUPER PRODISC LG 3D (Neuro Prosthesis/Implant) ×1 IMPLANT
FILTER STRAW FLUID ASPIR (MISCELLANEOUS) ×3 IMPLANT
GAUZE 4X4 16PLY ~~LOC~~+RFID DBL (SPONGE) ×6 IMPLANT
GAUZE SPONGE 4X4 12PLY STRL (GAUZE/BANDAGES/DRESSINGS) ×1 IMPLANT
GLOVE SRG 8 PF TXTR STRL LF DI (GLOVE) ×2 IMPLANT
GLOVE SURG ENC MOIS LTX SZ7.5 (GLOVE) ×3 IMPLANT
GLOVE SURG ENC TEXT LTX SZ8 (GLOVE) ×3 IMPLANT
GLOVE SURG MICRO LTX SZ7.5 (GLOVE) ×3 IMPLANT
GLOVE SURG UNDER POLY LF SZ7 (GLOVE) ×3 IMPLANT
GLOVE SURG UNDER POLY LF SZ8 (GLOVE) ×3
GLOVE SURG UNDER POLY LF SZ8.5 (GLOVE) ×3 IMPLANT
GOWN STRL REUS W/ TWL LRG LVL3 (GOWN DISPOSABLE) ×4 IMPLANT
GOWN STRL REUS W/ TWL XL LVL3 (GOWN DISPOSABLE) ×4 IMPLANT
GOWN STRL REUS W/TWL LRG LVL3 (GOWN DISPOSABLE) ×6
GOWN STRL REUS W/TWL XL LVL3 (GOWN DISPOSABLE) ×6
INLAY POLY W/TANTALUM LG 10MM (Inlay) ×1 IMPLANT
KIT BASIN OR (CUSTOM PROCEDURE TRAY) ×3 IMPLANT
KIT TURNOVER KIT B (KITS) ×3 IMPLANT
NDL 18GX1X1/2 (RX/OR ONLY) (NEEDLE) ×2 IMPLANT
NDL HYPO 25GX1X1/2 BEV (NEEDLE) IMPLANT
NEEDLE 18GX1X1/2 (RX/OR ONLY) (NEEDLE) ×3 IMPLANT
NEEDLE HYPO 25GX1X1/2 BEV (NEEDLE) ×3 IMPLANT
NS IRRIG 1000ML POUR BTL (IV SOLUTION) ×3 IMPLANT
PACK LAMINECTOMY ORTHO (CUSTOM PROCEDURE TRAY) ×3 IMPLANT
PACK UNIVERSAL I (CUSTOM PROCEDURE TRAY) ×3 IMPLANT
PAD ARMBOARD 7.5X6 YLW CONV (MISCELLANEOUS) ×12 IMPLANT
SPONGE INTESTINAL PEANUT (DISPOSABLE) ×4 IMPLANT
SPONGE SURGIFOAM ABS GEL 100 (HEMOSTASIS) ×3 IMPLANT
SPONGE T-LAP 18X18 ~~LOC~~+RFID (SPONGE) ×3 IMPLANT
SPONGE T-LAP 4X18 ~~LOC~~+RFID (SPONGE) ×5 IMPLANT
STRIP CLOSURE SKIN 1/2X4 (GAUZE/BANDAGES/DRESSINGS) ×3 IMPLANT
SUT MNCRL AB 4-0 PS2 18 (SUTURE) ×1 IMPLANT
SUT PDS AB 1 CTX 36 (SUTURE) ×3 IMPLANT
SUT PROLENE 4 0 RB 1 (SUTURE)
SUT PROLENE 4-0 RB1 .5 CRCL 36 (SUTURE) IMPLANT
SUT PROLENE 5 0 CC1 (SUTURE) IMPLANT
SUT PROLENE 6 0 C 1 30 (SUTURE) ×2 IMPLANT
SUT PROLENE 6 0 CC (SUTURE) IMPLANT
SUT SILK 0 TIES 10X30 (SUTURE) ×2 IMPLANT
SUT SILK 2 0 TIES 10X30 (SUTURE) ×4 IMPLANT
SUT SILK 2 0SH CR/8 30 (SUTURE) IMPLANT
SUT SILK 3 0 TIES 17X18 (SUTURE)
SUT SILK 3 0SH CR/8 30 (SUTURE) IMPLANT
SUT SILK 3-0 18XBRD TIE BLK (SUTURE) ×2 IMPLANT
SUT VIC AB 0 CT1 18XCR BRD 8 (SUTURE) IMPLANT
SUT VIC AB 0 CT1 27 (SUTURE)
SUT VIC AB 0 CT1 27XBRD ANBCTR (SUTURE) ×4 IMPLANT
SUT VIC AB 0 CT1 8-18 (SUTURE) ×3
SUT VIC AB 2-0 CT1 18 (SUTURE) ×3 IMPLANT
SUT VIC AB 2-0 CT2 18 VCP726D (SUTURE) ×3 IMPLANT
SUT VIC AB 3-0 SH 27 (SUTURE)
SUT VIC AB 3-0 SH 27XBRD (SUTURE) IMPLANT
SYR BULB IRRIG 60ML STRL (SYRINGE) ×3 IMPLANT
SYR CONTROL 10ML LL (SYRINGE) ×4 IMPLANT
TOWEL GREEN STERILE (TOWEL DISPOSABLE) ×6 IMPLANT
TOWEL GREEN STERILE FF (TOWEL DISPOSABLE) ×3 IMPLANT
TRAY FOLEY W/BAG SLVR 16FR (SET/KITS/TRAYS/PACK) ×3
TRAY FOLEY W/BAG SLVR 16FR ST (SET/KITS/TRAYS/PACK) ×2 IMPLANT
WATER STERILE IRR 1000ML POUR (IV SOLUTION) ×3 IMPLANT

## 2021-10-11 NOTE — Addendum Note (Signed)
Addendum  created 10/11/21 1311 by Shary Decamp, CRNA  ? Charge Capture section accepted  ?  ?

## 2021-10-11 NOTE — Op Note (Signed)
PATIENT NAME: Robert Hess  ? ?MEDICAL RECORD NO.:   [id number]  ?  ?DATE OF BIRTH: [birth year] ?  ?DATE OF PROCEDURE: 10/11/2021  ? ?                            OPERATIVE REPORT ? ? ?PREOPERATIVE DIAGNOSES: ?1.  L4-5 degenerative disc disease ?2.  Ongoing chronic axial low back pain and right leg ? ?POSTOPERATIVE DIAGNOSES: ?1.  L4-5 degenerative disc disease ?2.  Ongoing chronic axial low back pain and right leg ? ?PROCEDURE: ?1.  L4-5 total disc arthroplasty (Prodisc) ?2.  Retroperitoneal exposure (co-surgeon with Dr. Sherald Hess) ? ?SURGEON:  Estill Bamberg, MD ? ?ASSISTANT:  Jason Coop, PA-C ? ?ANESTHESIA:  General endotracheal anesthesia. ? ?COMPLICATIONS:  None. ? ?DISPOSITION:  Stable. ? ?ESTIMATED BLOOD LOSS:  minimal ? ?INDICATIONS FOR SURGERY:  Briefly, Mr. Skeels is a very pleasant 41-year- ?old male, who has had ongoing debilitating pain in the low back, extending into the right leg, for many years.  The patient's imaging studies and work-up was highly suggestive of L4-5 degenerative disc disease being an origin of his pain.  He did fail multiple treatment measures, and continued to feel rather debilitated.  Given this, we did discuss proceeding with an L4-5 total disc arthroplasty, versus a fusion procedure.  After full understanding of the risks and benefits of surgery, he did wish to proceed with a total disc arthroplasty.  Of note, he was very aware of the possibility of the procedure not addressing his ongoing back pain.  He was also made very aware of the possibility of having to proceed with a fusion, in the event that a total disc arthroplasty may not seem feasible intraoperatively.  He did wish to proceed. ? ? ?OPERATIVE DETAILS:  On 10/11/2021, the patient was brought to surgery ?and general endotracheal anesthesia was administered.  The patient was ?placed supine on the hospital bed.  The patient's abdomen was prepped ?and draped in the usual sterile fashion.  An anterior  retroperitoneal ?approach was then performed by myself and Dr. Sherald Hess.  Once the anterior lumbar spine was noted, we did focus our attention on the L4/5 intervertebral space.  I then performed a thorough and complete L4/5 intervertebral diskectomy to the level of the posterior longitudinal ligament.   ?I then ensured appropriate rotation of the patient's spine, and I did Jaeshaun Riva out the midline.  At this point, an intervertebral trial was inserted, liberally using AP and lateral fluoroscopy.  Once I felt the trial was at the midline, and appropriately positioned on both AP and lateral fluoroscopy, troughs were made to accept the keels of the upper and lower endplates of the implant.  I did liberally use AP and lateral fluoroscopy while doing this.  The trial was then removed, and a Prodisc intervertebral total disc arthroplasty was inserted into the L4-5 intervertebral space.  Of note, I did select a 10 mm implant, with a large footprint, with 6 degrees of lordosis.  This was advanced into the disc space uneventfully.  The polyethylene spacer was then advanced between the upper and lower endplates to the appropriate position. I was very pleased with the final AP and lateral fluoroscopic images.  Bone wax was then placed anterior to the implant, in order to secure bleeding. The wound was then copiously irrigated.  The fascia was closed using #1 PDS.  The subcutaneous layer was closed using 0 Vicryl followed by  2-0 Vicryl, and the skin was closed using 4-0 Monocryl. ?Benzoin and Steri-Strips were applied followed by sterile dressing.   ?All instrument counts were correct at the termination of the procedure. ? ? ?Of note, Jason Coop was my assistant throughout surgery, and did aid ?in retraction, suctioning, placement of the intervertebral implant, and closure for the entire procedure. ? ? ?Estill Bamberg, MD  ?

## 2021-10-11 NOTE — Addendum Note (Signed)
Addendum  created 10/11/21 1312 by Shary Decamp, CRNA  ? Charge Capture section accepted  ?  ?

## 2021-10-11 NOTE — Transfer of Care (Signed)
Immediate Anesthesia Transfer of Care Note ? ?Patient: Robert Hess ? ?Procedure(s) Performed: TOTAL DISC ARTHROPLASTY LUMBAR 4- LUMBAR 5 (Abdomen) ?ABDOMINAL EXPOSURE ? ?Patient Location: PACU ? ?Anesthesia Type:General ? ?Level of Consciousness: awake and oriented ? ?Airway & Oxygen Therapy: Patient Spontanous Breathing and Patient connected to nasal cannula oxygen ? ?Post-op Assessment: Report given to RN and Post -op Vital signs reviewed and stable ? ?Post vital signs: Reviewed ? ?Last Vitals:  ?Vitals Value Taken Time  ?BP 124/71 10/11/21 1204  ?Temp    ?Pulse 99 10/11/21 1205  ?Resp 16 10/11/21 1205  ?SpO2 100 % 10/11/21 1205  ?Vitals shown include unvalidated device data. ? ?Last Pain:  ?Vitals:  ? 10/11/21 0637  ?TempSrc:   ?PainSc: 6   ?   ? ?  ? ?Complications: No notable events documented. ?

## 2021-10-11 NOTE — Op Note (Signed)
Date: October 11, 2021 ? ?Preoperative diagnosis: Chronic lower back pain ? ?Postoperative diagnosis: Same ? ?Procedure: Anterior spine exposure at the L4-L5 disc space via anterior retroperitoneal approach for L4-L5 ALIF ? ?Surgeon: Dr. Cephus Shelling, MD ? ?Co-surgeon: Dr. Estill Bamberg, MD ? ?Indications: 41 year old male with chronic lower back pain that has been evaluated by Dr. Yevette Edwards who has recommended L4-L5 ALIF.  Vascular surgery has been asked to assist with anterior spine exposure at the L4-L5 disc space.  Patient presents today after risk benefits discussed. ? ?Findings: The L4-L5 disc space was marked over the left rectus muscle with a fluoroscopic C arm.  Paramedian incision was made over the left rectus muscle and ultimately after the anterior rectus sheath was opened the muscle was mobilized to the midline and entered the retroperitoneum to mobilize peritoneum and left ureter across midline.  Posterior rectus sheath was opened above arcuate line after the peritoneum was bluntly dissected off of this.  The left iliac artery and vein were then mobilized to the midline including ligating at least four different iliolumbar branches as well as a segmental branch.  Fixed NuVasive retractor was placed.  Confirm we were at the correct L4-L5 level on lateral fluoroscopy. ? ?Anesthesia: General ? ?Details: Patient was taken to the operating room after informed consent was obtained.  Placed on the operative table supine position.  General endotracheal anesthesia was induced.  Fluoroscopic C-arm was brought in the lateral position and the L4-5 disc space was marked over the left rectus muscle.  The left abdominal wall was then prepped and draped in standard sterile fashion, timeout performed, antibiotics administered.  Initially made a paramedian incision over our preoperative mark over the left rectus muscle and dissected down with Bovie cautery until we encountered the anterior rectus sheath.  Cerebellar  retractors were placed for added visualization.  The left rectus sheath was opened longitudinally with Bovie cautery and then the left rectus muscle was mobilized to the midline and then I entered the retroperitoneum to mobilize peritoneum and left ureter out of the retroperitoneum and across the disc space with KD bluntly.  I mobilized peritoneum off the posterior rectus sheath above arcuate line and then used Metzenbaum scissors to open the posterior rectus sheath above arcuate line to get to the L4-L5 disc space.  Once we had good working room here, I placed a Balfour and a wet lap pad in the wound and Dr. Yevette Edwards used hand-held Wiley retractors to pull the peritoneum and left ureter to the midline.  I initially bluntly mobilized left iliac artery to the midline with KD and suction until I visualize the left iliac vein.  I encountered at least four different iliolumbar branches that were ligated between 2-0 silk ties clips and divided.  I also encountered a segmental branch that was divided between clips on the left side.  I then fully mobilized the left iliac artery and vein until I got to the opposite side of the disc space at L4-L5.  Once I felt I had good working room here, a fixed NuVasive retractor was brought on the field.  I placed a 180 reverse lip to the right side of the space with a 160 reverse lip to the left side of the disc space and then a 140 cranial and 160 caudal reverse lip retractors.  Spinal needle was placed in the disc space and we confirmed on lateral fluoroscopy we were at the correct level.  Case was turned over to Dr. Yevette Edwards. ? ?  Complication: None ? ?Condition: Stable ? ?Cephus Shelling, MD ?[facility] ?Office: [phone] ? ? ?Cephus Shelling ? ? ?

## 2021-10-11 NOTE — Anesthesia Procedure Notes (Addendum)
Procedure Name: Intubation ?Date/Time: 10/11/2021 8:52 AM ?Performed by: Shary Decamp, CRNA ?Pre-anesthesia Checklist: Patient identified, Patient being monitored, Timeout performed, Emergency Drugs available and Suction available ?Patient Re-evaluated:Patient Re-evaluated prior to induction ?Oxygen Delivery Method: Circle System Utilized ?Preoxygenation: Pre-oxygenation with 100% oxygen ?Induction Type: IV induction ?Ventilation: Mask ventilation without difficulty ?Laryngoscope Size: Hyacinth Meeker and 3 ?Grade View: Grade I ?Tube type: Oral ?Tube size: 7.5 mm ?Number of attempts: 2 ?Airway Equipment and Method: Stylet ?Placement Confirmation: ETT inserted through vocal cords under direct vision, positive ETCO2 and breath sounds checked- equal and bilateral ?Secured at: 23 cm ?Tube secured with: Tape ?Dental Injury: Teeth and Oropharynx as per pre-operative assessment  ?Comments: Attempted with mill 2, blade was too short, Grade 1 view with Mill 3! ? ? ? ? ? ?

## 2021-10-11 NOTE — H&P (Signed)
? ? ? ?PREOPERATIVE H&P ? ?Chief Complaint: Low back pain ? ?HPI: ?Robert Hess is a 40 y.o. male who presents with ongoing pain in the low back ? ?MRI reveals L4/5 DDD ? ?Patient has failed multiple forms of conservative care and continues to have pain (see office notes for additional details regarding the patient's full course of treatment) ? ?Past Medical History:  ?Diagnosis Date  ? Acid reflux   ? occasional  ? ADHD (attention deficit hyperactivity disorder)   ? Anxiety   ? Back pain   ? MRSA infection   ? in college- pt had MRSA infection to back of neck, treated  ? ?Past Surgical History:  ?Procedure Laterality Date  ? ABDOMINAL SURGERY    ? GASTRIC BYPASS    ? HERNIA REPAIR    ? LAPAROSCOPIC GASTRIC BANDING    ? LAPAROSCOPIC REMOVAL OF MESENTERIC MASS N/A 12/29/2020  ? Procedure: Diagnostic laparoscopy, detorsion of mesenteric volvulus;  Surgeon: Berna Bue, MD;  Location: [facility] OR;  Service: General;  Laterality: N/A;  ? LAPAROSCOPY  04/20/2021  ? Procedure: DIAGNOSTIC LAPAROSCOPY, EXPLORATORY LAPAROTOMY, REDUCTION OF SMALL BOWEL VOLVULUS;  Surgeon: Gaynelle Adu, MD;  Location: [facility] ORS;  Service: General;;  ? ?Social History  ? ?Socioeconomic History  ? Marital status: Married  ?  Spouse name: Not on file  ? Number of children: Not on file  ? Years of education: Not on file  ? Highest education level: Not on file  ?Occupational History  ? Not on file  ?Tobacco Use  ? Smoking status: Never  ? Smokeless tobacco: Former  ?  Types: Snuff  ?Vaping Use  ? Vaping Use: Never used  ?Substance and Sexual Activity  ? Alcohol use: Not Currently  ?  Comment: occ  ? Drug use: Not Currently  ?  Types: Marijuana  ?  Comment: in college  ? Sexual activity: Not on file  ?Other Topics Concern  ? Not on file  ?Social History Narrative  ? Not on file  ? ?Social Determinants of Health  ? ?Financial Resource Strain: Not on file  ?Food Insecurity: Not on file  ?Transportation Needs: Not on file  ?Physical Activity: Not on  file  ?Stress: Not on file  ?Social Connections: Not on file  ? ?History reviewed. No pertinent family history. ?Allergies  ?Allergen Reactions  ? Other Hives  ?  *Rose Hair on a spider* (tarantula hair) ?*Rose Hair on a spider*   ?*Rose Hair on a spider* (tarantula hair)  ? Nsaids Other (See Comments)  ?  Had gastric bypass and unable to take. ?Had gastric bypass and unable to take.  ? ?Prior to Admission medications   ?Medication Sig Start Date End Date Taking? Authorizing Provider  ?acetaminophen (TYLENOL) 500 MG tablet Take 2 tablets (1,000 mg total) by mouth every 6 (six) hours as needed. 12/30/20  Yes Barnetta Chapel, PA-C  ?CALCIUM CITRATE PO Take 2 tablets by mouth daily.   Yes [provider]  ?lansoprazole (PREVACID) 15 MG capsule Take 15 mg by mouth daily as needed (acid reflux).   Yes [provider]  ?metFORMIN (GLUCOPHAGE) 500 MG tablet Take 500 mg by mouth daily with breakfast.   Yes [provider]  ?Multiple Vitamins tablet Take 1 tablet by mouth daily.   Yes [provider]  ?ondansetron (ZOFRAN-ODT) 4 MG disintegrating tablet Take 1 tablet (4 mg total) by mouth every 6 (six) hours as needed for nausea. ?Patient not taking: Reported on 09/29/2021  04/24/21   Maczis, Elmer Sow, PA-C  ?thiamine 100 MG tablet Take 1 tablet (100 mg total) by mouth daily. ?Patient not taking: Reported on 09/29/2021 04/25/21   Jacinto Halim, PA-C  ?traMADol (ULTRAM) 50 MG tablet Take 1 tablet (50 mg total) by mouth every 6 (six) hours as needed (breakthrough pain). ?Patient not taking: Reported on 09/29/2021 04/24/21   Jacinto Halim, PA-C  ? ? ? ?All other systems have been reviewed and were otherwise negative with the exception of those mentioned in the HPI and as above. ? ?Physical Exam: ?Vitals:  ? 10/11/21 0628  ?BP: 130/71  ?Pulse: 72  ?Resp: 17  ?Temp: 98 ?F (36.7 ?C)  ?SpO2: 97%  ? ? ?Body mass index is 28.5 kg/m?. ? ?General: Alert, no acute distress ?Cardiovascular: No pedal  edema ?Respiratory: No cyanosis, no use of accessory musculature ?Skin: No lesions in the area of chief complaint ?Neurologic: Sensation intact distally ?Psychiatric: Patient is competent for consent with normal mood and affect ?Lymphatic: No axillary or cervical lymphadenopathy ? ?Assessment/Plan: ?Ongoing low back and right leg pain, with a discogram notable for disc degeneration and a fissure extending posteriorly to L4-L5.  The patient's pain is likely secondary to his L4-L5 disc degeneration ?Plan for Procedure(s): ?TOTAL DISC ARTHROPLASTY LUMBAR 4- LUMBAR 5 ?ABDOMINAL EXPOSURE ? ? ?Jackelyn Hoehn, MD ?10/11/2021 ?8:07 AM  ?

## 2021-10-11 NOTE — Anesthesia Postprocedure Evaluation (Signed)
Anesthesia Post Note ? ?Patient: Robert Hess ? ?Procedure(s) Performed: TOTAL DISC ARTHROPLASTY LUMBAR 4- LUMBAR 5 (Abdomen) ?ABDOMINAL EXPOSURE ? ?  ? ?Patient location during evaluation: PACU ?Anesthesia Type: General ?Level of consciousness: awake ?Pain management: pain level controlled ?Respiratory status: spontaneous breathing ?Cardiovascular status: stable ?Postop Assessment: no apparent nausea or vomiting ?Anesthetic complications: no ? ? ?No notable events documented. ? ?Last Vitals:  ?Vitals:  ? 10/11/21 1219 10/11/21 1234  ?BP: 120/64 113/67  ?Pulse: 92 85  ?Resp: (!) 21 13  ?Temp:    ?SpO2: 100% 100%  ?  ?Last Pain:  ?Vitals:  ? 10/11/21 1234  ?TempSrc:   ?PainSc: 5   ? ? ?  ?  ?  ?  ?  ?  ? ?Secret Kristensen ? ? ? ? ?
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
## 2021-10-12 MED ORDER — OXYCODONE-ACETAMINOPHEN 5-325 MG PO TABS
1.0000 | ORAL_TABLET | ORAL | 0 refills | Status: DC | PRN
Start: 2021-10-12 — End: 2023-07-11

## 2021-10-12 MED ORDER — METHOCARBAMOL 500 MG PO TABS: 500.0000 mg | ORAL_TABLET | Freq: Four times a day (QID) | ORAL | 2 refills | Status: DC | PRN

## 2021-10-12 NOTE — Progress Notes (Addendum)
?  Progress Note ? ? ? ?10/12/2021 ?7:14 AM ?1 Day Post-Op ? ?Subjective:  has some incisional soreness.  He has walked this am-getting in and out of bed the hardest part.  ? ?afebrile ? ?Vitals:  ? 10/11/21 2257 10/12/21 0333  ?BP: (!) 116/59 112/63  ?Pulse: 92 85  ?Resp: 20 20  ?Temp: 98.2 ?F (36.8 ?C) 98 ?F (36.7 ?C)  ?SpO2: 99% 97%  ? ? ?Physical Exam: ?General:  no distress. ?Lungs:  non labored ?Incisions:  bandage is clean and dry ?Extremities:  palpable PT pulses bilaterally ?Abdomen:  soft, NT/ND ? ?CBC ?   ?Component Value Date/Time  ? WBC 6.2 10/04/2021 0921  ? RBC 5.01 10/04/2021 0921  ? HGB 15.0 10/04/2021 0921  ? HCT 46.2 10/04/2021 0921  ? PLT 279 10/04/2021 0921  ? MCV 92.2 10/04/2021 0921  ? MCH 29.9 10/04/2021 0921  ? MCHC 32.5 10/04/2021 0921  ? RDW 14.0 10/04/2021 0921  ? LYMPHSABS 3.7 04/20/2021 1527  ? MONOABS 0.8 04/20/2021 1527  ? EOSABS 0.1 04/20/2021 1527  ? BASOSABS 0.0 04/20/2021 1527  ? ? ?BMET ?   ?Component Value Date/Time  ? NA 138 10/04/2021 0921  ? NA 142 11/12/2018 0953  ? K 3.6 10/04/2021 0921  ? CL 105 10/04/2021 0921  ? CO2 27 10/04/2021 0921  ? GLUCOSE 91 10/04/2021 0921  ? BUN 15 10/04/2021 0921  ? BUN 13 11/12/2018 0953  ? CREATININE 0.72 10/04/2021 0921  ? CALCIUM 9.9 10/04/2021 0921  ? GFRNONAA >60 10/04/2021 0921  ? GFRAA >60 04/11/2019 0928  ? ? ?INR ?No results found for: INR ? ? ?Intake/Output Summary (Last 24 hours) at 10/12/2021 0714 ?Last data filed at 10/11/2021 1205 ?Gross per 24 hour  ?Intake 2600 ml  ?Output 400 ml  ?Net 2200 ml  ? ? ? ?Assessment/Plan:  41 y.o. male is s/p:  ?Anterior spine exposure at the L4-L5 disc space via anterior retroperitoneal approach for L4-L5 ALIF  ?1 Day Post-Op ? ? ?-pt doing well this am with palpable PT pulses bilaterally ?-doing well from vascular standpoint and f/u with [facility] prn. ? ? ?Doreatha Massed, PA-C ?[facility] ?[phone] ?10/12/2021 ?7:14 AM ? ?I have seen and evaluated the patient. I agree with the PA note as  documented above.  Postop day 1 status post anterior spine exposure at L4-L5 for ALIF.  Appropriate postop incisional tenderness.  Palpable PT pulse in the left foot.  No nausea vomiting.  Looks good from my standpoint. ? ?Cephus Shelling, MD ?[facility] ?Office: [phone] ? ? ?

## 2021-10-12 NOTE — Evaluation (Signed)
Occupational Therapy Evaluation ?Patient Details ?Name: Robert Hess ?MRN: [id number] ?DOB: 30-Oct-1980 ?Today's Date: 10/12/2021 ? ? ?History of Present Illness Pt is a 41 y/o male who presents s/p L4-L5 ALIF on 10/11/2021. PMH significant for gastric bypass, hernia repair and ADHD.  ? ?Clinical Impression ?  ?Pt live with wife and 4 kids but did not use any DME/AE at [facility]. Pt at this time was educated on how to follow precautions with ADLs. Pt was able to demonstrate on how to wear brace in session with cues on how to adjust as needed with activity. Pt at this time required no devices for ambulation but educated about a reacher and positioning with ADLS to follow precautions.   ?   ? ?Recommendations for follow up therapy are one component of a multi-disciplinary discharge planning process, led by the attending physician.  Recommendations may be updated based on patient status, additional functional criteria and insurance authorization.  ? ?Follow Up Recommendations ? Follow physician's recommendations for discharge plan and follow up therapies  ?  ?Assistance Recommended at Discharge PRN  ?Patient can return home with the following Assist for transportation;Assistance with cooking/housework ? ?  ?Functional Status Assessment ? Patient has had a recent decline in their functional status and demonstrates the ability to make significant improvements in function in a reasonable and predictable amount of time.  ?Equipment Recommendations ? None recommended by OT  ?  ?Recommendations for Other Services   ? ? ?  ?Precautions / Restrictions Precautions ?Precautions: Fall;Back ?Precaution Booklet Issued: Yes (comment) ?Precaution Comments: Reviewed handout and pt was cued for precautions during functional mobility. ?Required Braces or Orthoses: Spinal Brace ?Spinal Brace: Lumbar corset;Applied in standing position ?Restrictions ?Weight Bearing Restrictions: No  ? ?  ? ?Mobility Bed Mobility ?Overal bed mobility: Modified  Independent ?  ?  ?  ?  ?  ?  ?General bed mobility comments: HOB flat and rails lowered to simulate home environment. ?  ? ?Transfers ?Overall transfer level: Modified independent ?Equipment used: None ?  ?  ?  ?  ?  ?  ?  ?General transfer comment: Pt demonstrated good posture with power up to full stand. No assist required for balance. ?  ? ?  ?Balance Overall balance assessment: No apparent balance deficits (not formally assessed) ?  ?  ?  ?  ?  ?  ?  ?  ?  ?  ?  ?  ?  ?  ?  ?  ?  ?  ?   ? ?ADL either performed or assessed with clinical judgement  ? ?ADL Overall ADL's : Needs assistance/impaired ?Eating/Feeding: Independent;Sitting ?  ?Grooming: Wash/dry hands;Wash/dry face;Standing;Modified independent ?  ?Upper Body Bathing: Modified independent;Sitting ?  ?Lower Body Bathing: Modified independent;Sit to/from stand ?  ?Upper Body Dressing : Modified independent;Standing;Cueing for UE precautions ?  ?Lower Body Dressing: Modified independent;Sit to/from stand ?  ?Toilet Transfer: Modified Independent ?  ?Toileting- Clothing Manipulation and Hygiene: Modified independent ?  ?Tub/ Shower Transfer: Modified independent ?  ?Functional mobility during ADLs: Modified independent ?   ? ? ? ?Vision   ?   ?   ?Perception   ?  ?Praxis   ?  ? ?Pertinent Vitals/Pain Pain Assessment ?Pain Assessment: Faces ?Faces Pain Scale: Hurts a little bit ?Pain Location: Abdominal incision site ?Pain Descriptors / Indicators: Operative site guarding, Sore ?Pain Intervention(s): Limited activity within patient's tolerance  ? ? ? ?Hand Dominance   ?  ?Extremity/Trunk Assessment Upper Extremity  Assessment ?Upper Extremity Assessment: Overall WFL for tasks assessed ?  ?Lower Extremity Assessment ?Lower Extremity Assessment: Defer to PT evaluation ?  ?Cervical / Trunk Assessment ?Cervical / Trunk Assessment: Back Surgery ?  ?Communication Communication ?Communication: No difficulties ?  ?Cognition Arousal/Alertness: Awake/alert ?Behavior  During Therapy: [facility] for tasks assessed/performed ?Overall Cognitive Status: Within Functional Limits for tasks assessed ?  ?  ?  ?  ?  ?  ?  ?  ?  ?  ?  ?  ?  ?  ?  ?  ?  ?  ?  ?General Comments    ? ?  ?Exercises   ?  ?Shoulder Instructions    ? ? ?Home Living Family/patient expects to be discharged to:: Private residence ?Living Arrangements: Spouse/significant other;Children ?Available Help at Discharge: Family;Available 24 hours/day ?Type of Home: House ?Home Access: Stairs to enter ?Entrance Stairs-Number of Steps: 7 ?Entrance Stairs-Rails: Right ?  ?  ?  ?Bathroom Shower/Tub: Walk-in shower ?  ?  ?  ?  ?Home Equipment: Shower seat - built in ?  ?  ?  ? ?  ?Prior Functioning/Environment Prior Level of Function : Independent/Modified Independent;Working/employed;Driving ?  ?  ?  ?  ?  ?  ?  ?  ?  ? ?  ?  ?OT Problem List: Impaired balance (sitting and/or standing);Decreased knowledge of use of DME or AE;Pain ?  ?   ?OT Treatment/Interventions:    ?  ?OT Goals(Current goals can be found in the care plan section) Acute Rehab OT Goals ?Patient Stated Goal: to go home and be more active ?OT Goal Formulation: With patient ?Time For Goal Achievement: 10/26/21 ?Potential to Achieve Goals: Good  ?OT Frequency:   ?  ? ?Co-evaluation   ?  ?  ?  ?  ? ?  ?AM-PAC OT "6 Clicks" Daily Activity     ?Outcome Measure Help from another person eating meals?: None ?Help from another person taking care of personal grooming?: None ?Help from another person toileting, which includes using toliet, bedpan, or urinal?: None ?Help from another person bathing (including washing, rinsing, drying)?: None ?Help from another person to put on and taking off regular upper body clothing?: None ?Help from another person to put on and taking off regular lower body clothing?: None ?6 Click Score: 24 ?  ?End of Session Equipment Utilized During Treatment: Gait belt ? ?Activity Tolerance: Patient tolerated treatment well ?Patient left: in bed;with call  bell/phone within reach ? ?OT Visit Diagnosis: Unsteadiness on feet (R26.81);Other abnormalities of gait and mobility (R26.89);Muscle weakness (generalized) (M62.81);Pain ?Pain - part of body:  (back)  ?              ?Time: 2620-3559 ?OT Time Calculation (min): 32 min ?Charges:  OT General Charges ?$OT Visit: 1 Visit ?OT Evaluation ?$OT Eval Low Complexity: 1 Low ? ?Alphia Moh OTR/L  ?Acute Rehab Services  ?[phone] office number ?[phone] pager number ? ? ?Alphia Moh ?10/12/2021, 11:39 AM ?

## 2021-10-12 NOTE — Evaluation (Signed)
Physical Therapy Evaluation and Discharge ? ?Patient Details ?Name: Robert Hess ?MRN: [id number] ?DOB: 15-Jun-1981 ?Today's Date: 10/12/2021 ? ?History of Present Illness ? Pt is a 41 y/o male who presents s/p L4-L5 ALIF on 10/11/2021. PMH significant for gastric bypass, hernia repair and ADHD. ?  ?Clinical Impression ? Patient evaluated by Physical Therapy with no further acute PT needs identified. All education has been completed and the patient has no further questions. Pt was able to demonstrate transfers and ambulation with gross modified independence and no AD. Pt was educated on precautions, brace application/wearing schedule, appropriate activity progression, and car transfer. See below for any follow-up Physical Therapy or equipment needs. PT is signing off. Thank you for this referral.    ?   ? ?Recommendations for follow up therapy are one component of a multi-disciplinary discharge planning process, led by the attending physician.  Recommendations may be updated based on patient status, additional functional criteria and insurance authorization. ? ?Follow Up Recommendations No PT follow up ? ?  ?Assistance Recommended at Discharge PRN  ?Patient can return home with the following ? Assistance with cooking/housework;Assist for transportation ? ?  ?Equipment Recommendations None recommended by PT  ?Recommendations for Other Services ?    ?  ?Functional Status Assessment Patient has had a recent decline in their functional status and demonstrates the ability to make significant improvements in function in a reasonable and predictable amount of time.  ? ?  ?Precautions / Restrictions Precautions ?Precautions: Fall;Back ?Precaution Booklet Issued: Yes (comment) ?Precaution Comments: Reviewed handout and pt was cued for precautions during functional mobility. ?Required Braces or Orthoses: Spinal Brace ?Spinal Brace: Lumbar corset;Applied in sitting position ?Restrictions ?Weight Bearing Restrictions: No  ? ?   ? ?Mobility ? Bed Mobility ?Overal bed mobility: Modified Independent ?  ?  ?  ?  ?  ?  ?General bed mobility comments: HOB flat and rails lowered to simulate home environment. ?  ? ?Transfers ?Overall transfer level: Modified independent ?Equipment used: None ?  ?  ?  ?  ?  ?  ?  ?General transfer comment: Pt demonstrated good posture with power up to full stand. No assist required for balance. ?  ? ?Ambulation/Gait ?Ambulation/Gait assistance: Modified independent (Device/Increase time) ?Gait Distance (Feet): 560 Feet ?Assistive device: None ?Gait Pattern/deviations: Step-through pattern, Decreased stride length ?Gait velocity: Decreased ?Gait velocity interpretation: 1.31 - 2.62 ft/sec, indicative of limited community ambulator ?  ?General Gait Details: Slow but generally steady without AD. Good posture throughout. ? ?Stairs ?Stairs: Yes ?Stairs assistance: Modified independent (Device/Increase time) ?Stair Management: One rail Right, Alternating pattern, Forwards ?Number of Stairs: 10 ?General stair comments: VC's for sequencing and general safety. No assist required. ? ?Wheelchair Mobility ?  ? ?Modified Rankin (Stroke Patients Only) ?  ? ?  ? ?Balance Overall balance assessment: No apparent balance deficits (not formally assessed) ?  ?  ?  ?  ?  ?  ?  ?  ?  ?  ?  ?  ?  ?  ?  ?  ?  ?  ?   ? ? ? ?Pertinent Vitals/Pain Pain Assessment ?Pain Assessment: 0-10 ?Pain Score: 4  (in the abdomen, 2 in the back) ?Pain Location: Abdominal incision site ?Pain Descriptors / Indicators: Operative site guarding, Sore ?Pain Intervention(s): Limited activity within patient's tolerance, Monitored during session, Repositioned  ? ? ?Home Living Family/patient expects to be discharged to:: Private residence ?Living Arrangements: Spouse/significant other;Children ?Available Help at Discharge: Family;Available 24 hours/day ?  Type of Home: House ?Home Access: Stairs to enter ?Entrance Stairs-Rails: Right ?Entrance Stairs-Number of  Steps: 7 ?  ?  ?  ?   ?  ?Prior Function Prior Level of Function : Independent/Modified Independent;Working/employed;Driving ?  ?  ?  ?  ?  ?  ?  ?  ?  ? ? ?Hand Dominance  ?   ? ?  ?Extremity/Trunk Assessment  ? Upper Extremity Assessment ?Upper Extremity Assessment: Defer to OT evaluation ?  ? ?Lower Extremity Assessment ?Lower Extremity Assessment: Generalized weakness ?  ? ?Cervical / Trunk Assessment ?Cervical / Trunk Assessment: Back Surgery  ?Communication  ? Communication: No difficulties  ?Cognition Arousal/Alertness: Awake/alert ?Behavior During Therapy: [facility] for tasks assessed/performed ?Overall Cognitive Status: Within Functional Limits for tasks assessed ?  ?  ?  ?  ?  ?  ?  ?  ?  ?  ?  ?  ?  ?  ?  ?  ?  ?  ?  ? ?  ?General Comments   ? ?  ?Exercises    ? ?Assessment/Plan  ?  ?PT Assessment Patient does not need any further PT services  ?PT Problem List   ? ?   ?  ?PT Treatment Interventions     ? ?PT Goals (Current goals can be found in the Care Plan section)  ?Acute Rehab PT Goals ?Patient Stated Goal: Home today, back to the gym, back to work ?PT Goal Formulation: All assessment and education complete, DC therapy ? ?  ?Frequency   ?  ? ? ?Co-evaluation   ?  ?  ?  ?  ? ? ?  ?AM-PAC PT "6 Clicks" Mobility  ?Outcome Measure Help needed turning from your back to your side while in a flat bed without using bedrails?: None ?Help needed moving from lying on your back to sitting on the side of a flat bed without using bedrails?: None ?Help needed moving to and from a bed to a chair (including a wheelchair)?: None ?Help needed standing up from a chair using your arms (e.g., wheelchair or bedside chair)?: None ?Help needed to walk in hospital room?: None ?Help needed climbing 3-5 steps with a railing? : None ?6 Click Score: 24 ? ?  ?End of Session Equipment Utilized During Treatment: Gait belt;Back brace ?Activity Tolerance: Patient tolerated treatment well ?Patient left: in bed;with call bell/phone within  reach ?Nurse Communication: Mobility status ?PT Visit Diagnosis: Unsteadiness on feet (R26.81);Pain ?Pain - part of body:  (back/abdomen) ?  ? ?Time: 9628-3662 ?PT Time Calculation (min) (ACUTE ONLY): 14 min ? ? ?Charges:   PT Evaluation ?$PT Eval Low Complexity: 1 Low ?  ?  ?   ? ? ?Conni Slipper, PT, DPT ?Acute Rehabilitation Services ?Secure Chat Preferred ?Office: [phone]  ? ?Marylynn Pearson ?10/12/2021, 10:33 AM ? ?

## 2021-10-12 NOTE — Progress Notes (Signed)
? ? ?  Patient doing well  ?Has been ambulating ?Feels better than before surgery ? ? ?Physical Exam: ?Vitals:  ? 10/11/21 2257 10/12/21 0333  ?BP: (!) 116/59 112/63  ?Pulse: 92 85  ?Resp: 20 20  ?Temp: 98.2 ?F (36.8 ?C) 98 ?F (36.7 ?C)  ?SpO2: 99% 97%  ? ? ?Dressing in place ?NVI ? ?POD #1 s/p L4/5 TDR, doing excellent ? ?- up with PT/OT, encourage ambulation ?- Percocet for pain, Robaxin for muscle spasms ?- likely d/c home today with f/u in 2 weeks  ?

## 2021-10-15 ENCOUNTER — Encounter (HOSPITAL_COMMUNITY): Payer: Self-pay | Admitting: Orthopedic Surgery

## 2021-10-16 ENCOUNTER — Encounter (HOSPITAL_COMMUNITY): Payer: Self-pay | Admitting: Orthopedic Surgery

## 2021-10-18 MED FILL — Heparin Sodium (Porcine) Inj 1000 Unit/ML: INTRAMUSCULAR | Qty: 30 | Status: AC

## 2021-10-18 MED FILL — Sodium Chloride IV Soln 0.9%: INTRAVENOUS | Qty: 1000 | Status: AC

## 2021-10-26 NOTE — Discharge Summary (Signed)
? ? ?Patient ID: ?[name] ?MRN: [id number] ?DOB/AGE: 01/15/1981 41 y.o. ? ?Admit date: 10/11/2021 ?Discharge date: 10/12/2021 ? ?Admission Diagnoses:  ?Principal Problem: ?  Radiculopathy ? ? ?Discharge Diagnoses:  ?Same ? ?Past Medical History:  ?Diagnosis Date  ? Acid reflux   ? occasional  ? ADHD (attention deficit hyperactivity disorder)   ? Anxiety   ? Back pain   ? MRSA infection   ? in college- pt had MRSA infection to back of neck, treated  ? ? ?Surgeries: Procedure(s): ?TOTAL DISC ARTHROPLASTY LUMBAR 4- LUMBAR 5 ?ABDOMINAL EXPOSURE on 10/11/2021 ?  ?Consultants: Treatment Team:  ?Cephus Shelling, MD ? ?Discharged Condition: Improved ? ?Hospital Course: GUSS FARRUGGIA is an 41 y.o. male who was admitted 10/11/2021 for operative treatment of Radiculopathy. Patient has severe unremitting pain that affects sleep, daily activities, and work/hobbies. After pre-op clearance the patient was taken to the operating room on 10/11/2021 and underwent  Procedure(s): ?TOTAL DISC ARTHROPLASTY LUMBAR 4- LUMBAR 5 ?ABDOMINAL EXPOSURE.   ? ?Patient was given perioperative antibiotics:  ?Anti-infectives (From admission, onward)  ? ? Start     Dose/Rate Route Frequency Ordered Stop  ? 10/11/21 1700  ceFAZolin (ANCEF) IVPB 2g/100 mL premix       ? 2 g ?200 mL/hr over 30 Minutes Intravenous Every 8 hours 10/11/21 1316 10/12/21 0225  ? 10/11/21 0715  ceFAZolin (ANCEF) IVPB 2g/100 mL premix       ? 2 g ?200 mL/hr over 30 Minutes Intravenous On call to O.R. 10/11/21 3846 10/11/21 0855  ? ?  ?  ? ?Patient was given sequential compression devices, early ambulation to prevent DVT. ? ?Patient benefited maximally from hospital stay and there were no complications.   ? ?Recent vital signs: BP 110/64 (BP Location: Right Arm)   Pulse 95   Temp 97.8 ?F (36.6 ?C) (Oral)   Resp 17   Ht 6\' 3"  (1.905 m)   Wt 103.4 kg   SpO2 100%   BMI 28.50 kg/m?   ? ?Discharge Medications:   ?Allergies as of 10/12/2021   ? ?   Reactions  ? Other Hives  ?  *Rose Hair on a spider* (tarantula hair) ?*Rose Hair on a spider*   ?*Rose Hair on a spider* (tarantula hair)  ? Nsaids Other (See Comments)  ? Had gastric bypass and unable to take. ?Had gastric bypass and unable to take.  ? ?  ? ?  ?Medication List  ?  ? ?TAKE these medications   ? ?acetaminophen 500 MG tablet ?Commonly known as: TYLENOL ?Take 2 tablets (1,000 mg total) by mouth every 6 (six) hours as needed. ?  ?CALCIUM CITRATE PO ?Take 2 tablets by mouth daily. ?  ?lansoprazole 15 MG capsule ?Commonly known as: PREVACID ?Take 15 mg by mouth daily as needed (acid reflux). ?  ?metFORMIN 500 MG tablet ?Commonly known as: GLUCOPHAGE ?Take 500 mg by mouth daily with breakfast. ?  ?methocarbamol 500 MG tablet ?Commonly known as: ROBAXIN ?Take 1 tablet (500 mg total) by mouth every 6 (six) hours as needed for muscle spasms. ?  ?Multiple Vitamins tablet ?Take 1 tablet by mouth daily. ?  ?ondansetron 4 MG disintegrating tablet ?Commonly known as: ZOFRAN-ODT ?Take 1 tablet (4 mg total) by mouth every 6 (six) hours as needed for nausea. ?  ?oxyCODONE-acetaminophen 5-325 MG tablet ?Commonly known as: PERCOCET/ROXICET ?Take 1-2 tablets by mouth every 4 (four) hours as needed for severe pain. ?  ?thiamine 100 MG tablet ?Take 1  tablet (100 mg total) by mouth daily. ?  ?traMADol 50 MG tablet ?Commonly known as: ULTRAM ?Take 1 tablet (50 mg total) by mouth every 6 (six) hours as needed (breakthrough pain). ?  ? ?  ? ? ?Diagnostic Studies: DG Lumbar Spine 2-3 Views ? ?Result Date: 10/11/2021 ?CLINICAL DATA:  Disc arthroplasty at L4-L5 level EXAM: LUMBAR SPINE - 2-3 VIEW COMPARISON:  10/02/2020 FINDINGS: Fluoroscopic images show intervertebral disc spacers in the L4-L5 disc space. Fluoroscopic time was 50 seconds. Radiation dose was 43.42 mGy. IMPRESSION: Fluoroscopic assistance was provided for placement of intervertebral disc spacer at L4-L5 level. Electronically Signed   By: Ernie Avena M.D.   On: 10/11/2021 13:01  ? ?DG  C-Arm 1-60 Min-No Report ? ?Result Date: 10/11/2021 ?Fluoroscopy was utilized by the requesting physician.  No radiographic interpretation.  ? ?DG C-Arm 1-60 Min-No Report ? ?Result Date: 10/11/2021 ?Fluoroscopy was utilized by the requesting physician.  No radiographic interpretation.  ? ?DG C-Arm 1-60 Min-No Report ? ?Result Date: 10/11/2021 ?Fluoroscopy was utilized by the requesting physician.  No radiographic interpretation.  ? ?DG OR LOCAL ABDOMEN ? ?Result Date: 10/11/2021 ?CLINICAL DATA:  L4/5 disc arthroplasty. Rule out foreign body per protocol EXAM: OR LOCAL ABDOMEN COMPARISON:  04/20/2021 abdominal CT FINDINGS: Disc arthroplasty at L4-5 with the hardware and regional clips. No unexpected foreign body. These results were called by telephone at the time of interpretation on 10/11/2021 at 11:42 am to OR staff in room 5. IMPRESSION: No unexpected foreign body after L4-5 disc arthroplasty. Electronically Signed   By: Tiburcio Pea M.D.   On: 10/11/2021 11:41   ? ?Disposition: Discharge disposition: 01-Home or Self Care ? ? ? ? ? ? ? ?POD #1 s/p L4/5 TDR, doing excellent ?  ?- up with PT/OT, encourage ambulation ?- Percocet for pain, Robaxin for muscle spasms ?-Scripts for pain sent to pharmacy electronically  ?-D/C instructions sheet printed and in chart ?-D/C today  ?-F/U in office 2 weeks  ? ?Signed: ?Eilene Ghazi Rowan Blaker ?10/26/2021, 10:36 AM ? ? ? ? ? ?  ?

## 2022-02-15 DIAGNOSIS — M5186 Other intervertebral disc disorders, lumbar region: Secondary | ICD-10-CM | POA: Diagnosis not present

## 2022-02-15 DIAGNOSIS — Z9889 Other specified postprocedural states: Secondary | ICD-10-CM | POA: Diagnosis not present

## 2022-02-15 DIAGNOSIS — M6281 Muscle weakness (generalized): Secondary | ICD-10-CM | POA: Diagnosis not present

## 2022-07-25 DIAGNOSIS — E669 Obesity, unspecified: Secondary | ICD-10-CM | POA: Diagnosis not present

## 2022-07-25 DIAGNOSIS — Z125 Encounter for screening for malignant neoplasm of prostate: Secondary | ICD-10-CM | POA: Diagnosis not present

## 2022-07-31 DIAGNOSIS — Z1212 Encounter for screening for malignant neoplasm of rectum: Secondary | ICD-10-CM | POA: Diagnosis not present

## 2022-10-01 DIAGNOSIS — Z9889 Other specified postprocedural states: Secondary | ICD-10-CM | POA: Diagnosis not present

## 2022-10-01 DIAGNOSIS — K432 Incisional hernia without obstruction or gangrene: Secondary | ICD-10-CM | POA: Diagnosis not present

## 2022-10-04 ENCOUNTER — Other Ambulatory Visit (HOSPITAL_COMMUNITY): Payer: Self-pay | Admitting: Student

## 2022-10-04 DIAGNOSIS — Z9889 Other specified postprocedural states: Secondary | ICD-10-CM

## 2022-10-04 DIAGNOSIS — K432 Incisional hernia without obstruction or gangrene: Secondary | ICD-10-CM

## 2022-10-09 ENCOUNTER — Ambulatory Visit (HOSPITAL_BASED_OUTPATIENT_CLINIC_OR_DEPARTMENT_OTHER)
Admission: RE | Admit: 2022-10-09 | Discharge: 2022-10-09 | Disposition: A | Discharge status: Home / Self Care | Payer: 59 | Source: Ambulatory Visit | Attending: Student | Admitting: Student

## 2022-10-09 DIAGNOSIS — K432 Incisional hernia without obstruction or gangrene: Secondary | ICD-10-CM | POA: Diagnosis not present

## 2022-10-09 DIAGNOSIS — Z9889 Other specified postprocedural states: Secondary | ICD-10-CM

## 2022-10-09 DIAGNOSIS — K439 Ventral hernia without obstruction or gangrene: Secondary | ICD-10-CM | POA: Diagnosis not present

## 2022-10-09 MED ORDER — IOHEXOL 300 MG/ML  SOLN
80.0000 mL | Freq: Once | INTRAMUSCULAR | Status: AC | PRN
  Administered 2022-10-09: 80 mL via INTRAVENOUS

## 2022-10-25 DIAGNOSIS — F32A Depression, unspecified: Secondary | ICD-10-CM | POA: Diagnosis not present

## 2022-10-25 DIAGNOSIS — R635 Abnormal weight gain: Secondary | ICD-10-CM | POA: Diagnosis not present

## 2022-10-25 DIAGNOSIS — F419 Anxiety disorder, unspecified: Secondary | ICD-10-CM | POA: Diagnosis not present

## 2022-10-25 DIAGNOSIS — R632 Polyphagia: Secondary | ICD-10-CM | POA: Diagnosis not present

## 2022-10-25 DIAGNOSIS — F900 Attention-deficit hyperactivity disorder, predominantly inattentive type: Secondary | ICD-10-CM | POA: Diagnosis not present

## 2022-11-01 DIAGNOSIS — Z9884 Bariatric surgery status: Secondary | ICD-10-CM | POA: Diagnosis not present

## 2022-11-01 DIAGNOSIS — K432 Incisional hernia without obstruction or gangrene: Secondary | ICD-10-CM | POA: Diagnosis not present

## 2022-11-01 DIAGNOSIS — L987 Excessive and redundant skin and subcutaneous tissue: Secondary | ICD-10-CM | POA: Diagnosis not present

## 2022-11-01 DIAGNOSIS — M6208 Separation of muscle (nontraumatic), other site: Secondary | ICD-10-CM | POA: Diagnosis not present

## 2023-01-03 DIAGNOSIS — K432 Incisional hernia without obstruction or gangrene: Secondary | ICD-10-CM | POA: Diagnosis not present

## 2023-01-03 DIAGNOSIS — Z9884 Bariatric surgery status: Secondary | ICD-10-CM | POA: Diagnosis not present

## 2023-01-08 DIAGNOSIS — K439 Ventral hernia without obstruction or gangrene: Secondary | ICD-10-CM | POA: Diagnosis not present

## 2023-01-23 DIAGNOSIS — Z68.41 Body mass index (BMI) pediatric, greater than or equal to 95th percentile for age: Secondary | ICD-10-CM | POA: Diagnosis not present

## 2023-01-23 DIAGNOSIS — F419 Anxiety disorder, unspecified: Secondary | ICD-10-CM | POA: Diagnosis not present

## 2023-01-23 DIAGNOSIS — F900 Attention-deficit hyperactivity disorder, predominantly inattentive type: Secondary | ICD-10-CM | POA: Diagnosis not present

## 2023-01-23 DIAGNOSIS — Z713 Dietary counseling and surveillance: Secondary | ICD-10-CM | POA: Diagnosis not present

## 2023-01-23 DIAGNOSIS — F32A Depression, unspecified: Secondary | ICD-10-CM | POA: Diagnosis not present

## 2023-01-23 DIAGNOSIS — Z7182 Exercise counseling: Secondary | ICD-10-CM | POA: Diagnosis not present

## 2023-01-23 DIAGNOSIS — Z00129 Encounter for routine child health examination without abnormal findings: Secondary | ICD-10-CM | POA: Diagnosis not present

## 2023-01-30 DIAGNOSIS — K432 Incisional hernia without obstruction or gangrene: Secondary | ICD-10-CM | POA: Diagnosis not present

## 2023-02-11 DIAGNOSIS — Z8719 Personal history of other diseases of the digestive system: Secondary | ICD-10-CM | POA: Diagnosis not present

## 2023-02-28 DIAGNOSIS — F413 Other mixed anxiety disorders: Secondary | ICD-10-CM | POA: Diagnosis not present

## 2023-02-28 DIAGNOSIS — G3184 Mild cognitive impairment, so stated: Secondary | ICD-10-CM | POA: Diagnosis not present

## 2023-02-28 DIAGNOSIS — F332 Major depressive disorder, recurrent severe without psychotic features: Secondary | ICD-10-CM | POA: Diagnosis not present

## 2023-02-28 DIAGNOSIS — F422 Mixed obsessional thoughts and acts: Secondary | ICD-10-CM | POA: Diagnosis not present

## 2023-03-13 DIAGNOSIS — G3184 Mild cognitive impairment, so stated: Secondary | ICD-10-CM | POA: Diagnosis not present

## 2023-04-11 DIAGNOSIS — F413 Other mixed anxiety disorders: Secondary | ICD-10-CM | POA: Diagnosis not present

## 2023-04-11 DIAGNOSIS — G3184 Mild cognitive impairment, so stated: Secondary | ICD-10-CM | POA: Diagnosis not present

## 2023-04-11 DIAGNOSIS — F332 Major depressive disorder, recurrent severe without psychotic features: Secondary | ICD-10-CM | POA: Diagnosis not present

## 2023-04-11 DIAGNOSIS — F422 Mixed obsessional thoughts and acts: Secondary | ICD-10-CM | POA: Diagnosis not present

## 2023-04-22 DIAGNOSIS — Z23 Encounter for immunization: Secondary | ICD-10-CM | POA: Diagnosis not present

## 2023-04-26 ENCOUNTER — Ambulatory Visit (INDEPENDENT_AMBULATORY_CARE_PROVIDER_SITE_OTHER): Payer: 59

## 2023-04-26 ENCOUNTER — Ambulatory Visit (INDEPENDENT_AMBULATORY_CARE_PROVIDER_SITE_OTHER): Payer: 59 | Admitting: Podiatry

## 2023-04-26 DIAGNOSIS — M778 Other enthesopathies, not elsewhere classified: Secondary | ICD-10-CM | POA: Diagnosis not present

## 2023-04-26 DIAGNOSIS — M722 Plantar fascial fibromatosis: Secondary | ICD-10-CM | POA: Diagnosis not present

## 2023-04-26 NOTE — Progress Notes (Signed)
  Subjective:  Patient ID: Robert Hess, male    DOB: 03/25/1981,  MRN: [id number]  Chief Complaint  Patient presents with   Foot Pain    Left heel  pain, has been going on for about a month so worse in the morning when he gets out of bed and when being active, has changed shoes , stretches, tylenol, icing it, keeping it elevated      42 y.o. male presents with the above complaint.  With his family for pain in the left heel.  Pain has been going on for about a month.  Hurts the worst when he gets out of bed in the morning.  Also bad on hard surfaces.  He has changed his shoes and the doing some icing.   Review of Systems: Negative except as noted in the HPI. Denies N/V/F/Ch.   Objective:  There were no vitals filed for this visit. There is no height or weight on file to calculate BMI. Constitutional Well developed. Well nourished.  Vascular Dorsalis pedis pulses palpable bilaterally. Posterior tibial pulses palpable bilaterally. Capillary refill normal to all digits.  No cyanosis or clubbing noted. Pedal hair growth normal.  Neurologic Normal speech. Oriented to person, place, and time. Epicritic sensation to light touch grossly present bilaterally.  Dermatologic Nails well groomed and normal in appearance. No open wounds. No skin lesions.  Orthopedic: Normal joint ROM without pain or crepitus bilaterally. No visible deformities. Tender to palpation at the calcaneal tuber left. No pain with calcaneal squeeze left. Ankle ROM diminished range of motion left. Silfverskiold Test: negative left.   Radiographs: Taken and reviewed. No acute fractures or dislocations. No evidence of stress fracture.  Plantar heel spur present. Posterior heel spur absent.   Assessment:   1. Capsulitis of left foot    Plan:  Patient was evaluated and treated and all questions answered.  Plantar Fasciitis, left - XR reviewed as above.  - Educated on icing and stretching. Instructions given.  -  Injection delivered to the plantar fascia as below. - DME: Night splint dispensed. Powersteps dispensed - Pharmacologic management: Can't take NSAIDs  Procedure: Injection Tendon/Ligament Location: Left plantar fascia at the glabrous junction; lateral approach. Skin Prep: alcohol Injectate: 1 cc 0.5% marcaine plain, 1 cc kenalog 10. Disposition: Patient tolerated procedure well. Injection site dressed with a band-aid.  No follow-ups on file.

## 2023-04-26 NOTE — Patient Instructions (Signed)

## 2023-05-08 DIAGNOSIS — K439 Ventral hernia without obstruction or gangrene: Secondary | ICD-10-CM | POA: Diagnosis not present

## 2023-05-09 DIAGNOSIS — K432 Incisional hernia without obstruction or gangrene: Secondary | ICD-10-CM | POA: Diagnosis not present

## 2023-05-10 DIAGNOSIS — K432 Incisional hernia without obstruction or gangrene: Secondary | ICD-10-CM | POA: Diagnosis not present

## 2023-05-11 DIAGNOSIS — K439 Ventral hernia without obstruction or gangrene: Secondary | ICD-10-CM | POA: Diagnosis not present

## 2023-05-22 DIAGNOSIS — Z9889 Other specified postprocedural states: Secondary | ICD-10-CM | POA: Diagnosis not present

## 2023-05-22 DIAGNOSIS — Z8719 Personal history of other diseases of the digestive system: Secondary | ICD-10-CM | POA: Diagnosis not present

## 2023-06-13 ENCOUNTER — Ambulatory Visit: Payer: 59 | Admitting: Podiatry

## 2023-06-27 ENCOUNTER — Encounter: Payer: Self-pay | Admitting: Podiatry

## 2023-06-27 ENCOUNTER — Ambulatory Visit (INDEPENDENT_AMBULATORY_CARE_PROVIDER_SITE_OTHER): Payer: Medicaid Other | Admitting: Podiatry

## 2023-06-27 DIAGNOSIS — M722 Plantar fascial fibromatosis: Secondary | ICD-10-CM | POA: Diagnosis not present

## 2023-06-27 MED ORDER — TRIAMCINOLONE ACETONIDE 10 MG/ML IJ SUSP
10.0000 mg | Freq: Once | INTRAMUSCULAR | Status: AC
  Administered 2023-06-27: 10 mg

## 2023-06-27 NOTE — Patient Instructions (Signed)

## 2023-06-30 ENCOUNTER — Encounter: Payer: Self-pay | Admitting: Podiatry

## 2023-06-30 NOTE — Progress Notes (Signed)
  Subjective:  Patient ID: Robert Hess, male    DOB: February 25, 1981,  MRN: [id number]  Chief Complaint  Patient presents with   Plantar Fasciitis    Patient states the injection are helping that but it is not gone , just improving .  Did a 25 mile walk and he was really sore after .    42 y.o. male presents with the above complaint.  Reports previous injection helped and that he was doing great for period time.  Reports he recently had a lot of activity walking with activity. Has had some recurrence over the past couple of weeks due to this. Rates soreness 3/10 pain left heel.   Review of Systems: Negative except as noted in the HPI. Denies N/V/F/Ch.   Objective:  There were no vitals filed for this visit. There is no height or weight on file to calculate BMI. Constitutional Well developed. Well nourished.  Vascular Dorsalis pedis pulses palpable bilaterally. Posterior tibial pulses palpable bilaterally. Capillary refill normal to all digits.  No cyanosis or clubbing noted. Pedal hair growth normal.  Neurologic Normal speech. Oriented to person, place, and time. Epicritic sensation to light touch grossly present bilaterally.  Dermatologic Nails well groomed and normal in appearance. No open wounds. No skin lesions.  Orthopedic: Normal joint ROM without pain or crepitus bilaterally. No visible deformities. Tender to palpation at the calcaneal tuber left. No pain with calcaneal squeeze left. Ankle ROM diminished range of motion left. Silfverskiold Test: negative left.   Radiographs: Deferred today  Assessment:   1. Plantar fasciitis of left foot    Plan:  Patient was evaluated and treated and all questions answered.  Plantar Fasciitis, left -Has been using over-the-counter night splint and Superfeet inserts -Instructed continued stretching and icing -Corticosteroid injection administered to the left heel as described below after discussion of risk, benefits alternative  therapies. -Oral medication deferred, patient cannot take NSAIDs - Follow-up in 2 weeks for reevaluation, will evaluate for custom orthotics if pain has improved.  Procedure: Injection Tendon/Ligament Location: Left plantar fascia at the glabrous junction; lateral approach. Skin Prep: alcohol Injectate: 1 cc 0.5% marcaine plain, 1 cc kenalog 10. Disposition: Patient tolerated procedure well. Injection site dressed with a band-aid.  Return in about 2 weeks (around 07/11/2023) for Plantar Fasciitis.

## 2023-07-11 ENCOUNTER — Encounter: Payer: Self-pay | Admitting: Podiatry

## 2023-07-11 ENCOUNTER — Ambulatory Visit (INDEPENDENT_AMBULATORY_CARE_PROVIDER_SITE_OTHER): Payer: 59 | Admitting: Podiatry

## 2023-07-11 DIAGNOSIS — M7662 Achilles tendinitis, left leg: Secondary | ICD-10-CM

## 2023-07-11 DIAGNOSIS — M722 Plantar fascial fibromatosis: Secondary | ICD-10-CM | POA: Diagnosis not present

## 2023-07-11 MED ORDER — PREDNISONE 10 MG PO TABS: ORAL_TABLET | ORAL | 0 refills | Status: AC

## 2023-07-11 NOTE — Progress Notes (Signed)
  Subjective:  Patient ID: Robert Hess, male    DOB: 12-Feb-1981,  MRN: [id number]  Chief Complaint  Patient presents with   Plantar Fasciitis    He is here for a follow up for the left foot and he feels the injection helped for a short while and when he was at home he felt the pain come back,. He was not sure if the injection works better on the outside or inside aspect of the foot.     43 y.o. male presents with the above complaint.  Reports previous injection helped for a short period of time.  Patient reports this pain is approximately 3 out of 10 he locates it to the back of his heel today.  He has been compliant with stretching.  He is using negative heel shoes.   Review of Systems: Negative except as noted in the HPI. Denies N/V/F/Ch.   Objective:  There were no vitals filed for this visit. There is no height or weight on file to calculate BMI. Constitutional Well developed. Well nourished.  Vascular Dorsalis pedis pulses palpable bilaterally. Posterior tibial pulses palpable bilaterally. Capillary refill normal to all digits.  No cyanosis or clubbing noted. Pedal hair growth normal.  Neurologic Normal speech. Oriented to person, place, and time. Epicritic sensation to light touch grossly present bilaterally.  Dermatologic Nails well groomed and normal in appearance. No open wounds. No skin lesions.  Orthopedic: Normal joint ROM without pain or crepitus bilaterally. No visible deformities. Tender to palpation at the calcaneal tuber left posteroirly, at level of achilles insertion just distal. No pain with calcaneal squeeze left. Ankle ROM diminished range of motion left. Silfverskiold Test: negative left.   Radiographs: Deferred today  Assessment:   1. Achilles tendinitis of left lower extremity   2. Plantar fasciitis of left foot    Plan:  Patient was evaluated and treated and all questions answered.  Achilles tendinitis, Plantar Fasciitis, left -Steroid  deferred at this time - Prescribing 12-day course of oral prednisone  taper - As patient has had continued pain for some time, we will immobilize in boot with heel lifts - Did discuss that once he returns to shoe gear, would benefit from good supportive shoe with some elevation of the heel -May continue with Achilles and plantar fascial stretching exercises   Return in about 3 weeks (around 08/01/2023) for Achilles tendinitis/heel pain.

## 2023-07-11 NOTE — Patient Instructions (Addendum)
 Achilles Tendinitis  with Rehab Achilles tendinitis is a disorder of the Achilles tendon. The Achilles tendon connects the large calf muscles (Gastrocnemius and Soleus) to the heel bone (calcaneus). This tendon is sometimes called the heel cord. It is important for pushing-off and standing on your toes and is important for walking, running, or jumping. Tendinitis is often caused by overuse and repetitive microtrauma. SYMPTOMS Pain, tenderness, swelling, warmth, and redness may occur over the Achilles tendon even at rest. Pain with pushing off, or flexing or extending the ankle. Pain that is worsened after or during activity. CAUSES  Overuse sometimes seen with rapid increase in exercise programs or in sports requiring running and jumping. Poor physical conditioning (strength and flexibility or endurance). Running sports, especially training running down hills. Inadequate warm-up before practice or play or failure to stretch before participation. Injury to the tendon. PREVENTION  Warm up and stretch before practice or competition. Allow time for adequate rest and recovery between practices and competition. Keep up conditioning. Keep up ankle and leg flexibility. Improve or keep muscle strength and endurance. Improve cardiovascular fitness. Use proper technique. Use proper equipment (shoes, skates). To help prevent recurrence, taping, protective strapping, or an adhesive bandage may be recommended for several weeks after healing is complete. PROGNOSIS  Recovery may take weeks to several months to heal. Longer recovery is expected if symptoms have been prolonged. Recovery is usually quicker if the inflammation is due to a direct blow as compared with overuse or sudden strain. RELATED COMPLICATIONS  Healing time will be prolonged if the condition is not correctly treated. The injury must be given plenty of time to heal. Symptoms can reoccur if activity is resumed too soon. Untreated,  tendinitis may increase the risk of tendon rupture requiring additional time for recovery and possibly surgery. TREATMENT  The first treatment consists of rest anti-inflammatory medication, and ice to relieve the pain. Stretching and strengthening exercises after resolution of pain will likely help reduce the risk of recurrence. Referral to a physical therapist or athletic trainer for further evaluation and treatment may be helpful. A walking boot or cast may be recommended to rest the Achilles tendon. This can help break the cycle of inflammation and microtrauma. Arch supports (orthotics) may be prescribed or recommended by your caregiver as an adjunct to therapy and rest. Surgery to remove the inflamed tendon lining or degenerated tendon tissue is rarely necessary and has shown less than predictable results. MEDICATION  Nonsteroidal anti-inflammatory medications, such as aspirin and ibuprofen , may be used for pain and inflammation relief. Do not take within 7 days before surgery. Take these as directed by your caregiver. Contact your caregiver immediately if any bleeding, stomach upset, or signs of allergic reaction occur. Other minor pain relievers, such as acetaminophen , may also be used. Pain relievers may be prescribed as necessary by your caregiver. Do not take prescription pain medication for longer than 4 to 7 days. Use only as directed and only as much as you need. Cortisone injections are rarely indicated. Cortisone injections may weaken tendons and predispose to rupture. It is better to give the condition more time to heal than to use them. HEAT AND COLD Cold is used to relieve pain and reduce inflammation for acute and chronic Achilles tendinitis. Cold should be applied for 10 to 15 minutes every 2 to 3 hours for inflammation and pain and immediately after any activity that aggravates your symptoms. Use ice packs or an ice massage. Heat may be used before performing stretching  and  strengthening activities prescribed by your caregiver. Use a heat pack or a warm soak. SEEK MEDICAL CARE IF: Symptoms get worse or do not improve in 2 weeks despite treatment. New, unexplained symptoms develop. Drugs used in treatment may produce side effects.  EXERCISES:  RANGE OF MOTION (ROM) AND STRETCHING EXERCISES - Achilles Tendinitis  These exercises may help you when beginning to rehabilitate your injury. Your symptoms may resolve with or without further involvement from your physician, physical therapist or athletic trainer. While completing these exercises, remember:  Restoring tissue flexibility helps normal motion to return to the joints. This allows healthier, less painful movement and activity. An effective stretch should be held for at least 30 seconds. A stretch should never be painful. You should only feel a gentle lengthening or release in the stretched tissue.  STRETCH  Gastroc, Standing  Place hands on wall. Extend right / left leg, keeping the front knee somewhat bent. Slightly point your toes inward on your back foot. Keeping your right / left heel on the floor and your knee straight, shift your weight toward the wall, not allowing your back to arch. You should feel a gentle stretch in the right / left calf. Hold this position for 10 seconds. Repeat 3 times. Complete this stretch 2 times per day.  STRETCH  Soleus, Standing  Place hands on wall. Extend right / left leg, keeping the other knee somewhat bent. Slightly point your toes inward on your back foot. Keep your right / left heel on the floor, bend your back knee, and slightly shift your weight over the back leg so that you feel a gentle stretch deep in your back calf. Hold this position for 10 seconds. Repeat 3 times. Complete this stretch 2 times per day.  STRETCH  Gastrocsoleus, Standing  Note: This exercise can place a lot of stress on your foot and ankle. Please complete this exercise only if specifically  instructed by your caregiver.  Place the ball of your right / left foot on a step, keeping your other foot firmly on the same step. Hold on to the wall or a rail for balance. Slowly lift your other foot, allowing your body weight to press your heel down over the edge of the step. You should feel a stretch in your right / left calf. Hold this position for 10 seconds. Repeat this exercise with a slight bend in your knee. Repeat 3 times. Complete this stretch 2 times per day.   STRENGTHENING EXERCISES - Achilles Tendinitis These exercises may help you when beginning to rehabilitate your injury. They may resolve your symptoms with or without further involvement from your physician, physical therapist or athletic trainer. While completing these exercises, remember:  Muscles can gain both the endurance and the strength needed for everyday activities through controlled exercises. Complete these exercises as instructed by your physician, physical therapist or athletic trainer. Progress the resistance and repetitions only as guided. You may experience muscle soreness or fatigue, but the pain or discomfort you are trying to eliminate should never worsen during these exercises. If this pain does worsen, stop and make certain you are following the directions exactly. If the pain is still present after adjustments, discontinue the exercise until you can discuss the trouble with your clinician.  STRENGTH - Plantar-flexors  Sit with your right / left leg extended. Holding onto both ends of a rubber exercise band/tubing, loop it around the ball of your foot. Keep a slight tension in the band. Slowly  push your toes away from you, pointing them downward. Hold this position for 10 seconds. Return slowly, controlling the tension in the band/tubing. Repeat 3 times. Complete this exercise 2 times per day.   STRENGTH - Plantar-flexors  Stand with your feet shoulder width apart. Steady yourself with a wall or table  using as little support as needed. Keeping your weight evenly spread over the width of your feet, rise up on your toes.* Hold this position for 10 seconds. Repeat 3 times. Complete this exercise 2 times per day.  *If this is too easy, shift your weight toward your right / left leg until you feel challenged. Ultimately, you may be asked to do this exercise with your right / left foot only.  STRENGTH  Plantar-flexors, Eccentric  Note: This exercise can place a lot of stress on your foot and ankle. Please complete this exercise only if specifically instructed by your caregiver.  Place the balls of your feet on a step. With your hands, use only enough support from a wall or rail to keep your balance. Keep your knees straight and rise up on your toes. Slowly shift your weight entirely to your right / left toes and pick up your opposite foot. Gently and with controlled movement, lower your weight through your right / left foot so that your heel drops below the level of the step. You will feel a slight stretch in the back of your calf at the end position. Use the healthy leg to help rise up onto the balls of both feet, then lower weight only on the right / left leg again. Build up to 15 repetitions. Then progress to 3 consecutive sets of 15 repetitions.* After completing the above exercise, complete the same exercise with a slight knee bend (about 30 degrees). Again, build up to 15 repetitions. Then progress to 3 consecutive sets of 15 repetitions.* Perform this exercise 2 times per day.  *When you easily complete 3 sets of 15, your physician, physical therapist or athletic trainer may advise you to add resistance by wearing a backpack filled with additional weight.  STRENGTH - Plantar Flexors, Seated  Sit on a chair that allows your feet to rest flat on the ground. If necessary, sit at the edge of the chair. Keeping your toes firmly on the ground, lift your right / left heel as far as you can without  increasing any discomfort in your ankle. Repeat 3 times. Complete this exercise 2 times a day.    SHOES Recommend the use of a supportive sneaker such as [place], [place], [employer], New Balance once you resume activity outside of the boot.  Doctor, [facility] does a good job with shoe recommendations and fitting your foot appropriately.

## 2023-07-31 DIAGNOSIS — Z1212 Encounter for screening for malignant neoplasm of rectum: Secondary | ICD-10-CM | POA: Diagnosis not present

## 2023-07-31 DIAGNOSIS — Z125 Encounter for screening for malignant neoplasm of prostate: Secondary | ICD-10-CM | POA: Diagnosis not present

## 2023-07-31 DIAGNOSIS — R7301 Impaired fasting glucose: Secondary | ICD-10-CM | POA: Diagnosis not present

## 2023-07-31 DIAGNOSIS — R7989 Other specified abnormal findings of blood chemistry: Secondary | ICD-10-CM | POA: Diagnosis not present

## 2023-08-06 DIAGNOSIS — Z9884 Bariatric surgery status: Secondary | ICD-10-CM | POA: Diagnosis not present

## 2023-08-06 DIAGNOSIS — M51369 Other intervertebral disc degeneration, lumbar region without mention of lumbar back pain or lower extremity pain: Secondary | ICD-10-CM | POA: Diagnosis not present

## 2023-08-06 DIAGNOSIS — E669 Obesity, unspecified: Secondary | ICD-10-CM | POA: Diagnosis not present

## 2023-08-06 DIAGNOSIS — G5793 Unspecified mononeuropathy of bilateral lower limbs: Secondary | ICD-10-CM | POA: Diagnosis not present

## 2023-08-06 DIAGNOSIS — R82998 Other abnormal findings in urine: Secondary | ICD-10-CM | POA: Diagnosis not present

## 2023-08-06 DIAGNOSIS — E781 Pure hyperglyceridemia: Secondary | ICD-10-CM | POA: Diagnosis not present

## 2023-08-06 DIAGNOSIS — R7301 Impaired fasting glucose: Secondary | ICD-10-CM | POA: Diagnosis not present

## 2023-08-06 DIAGNOSIS — Z1331 Encounter for screening for depression: Secondary | ICD-10-CM | POA: Diagnosis not present

## 2023-08-06 DIAGNOSIS — Z1339 Encounter for screening examination for other mental health and behavioral disorders: Secondary | ICD-10-CM | POA: Diagnosis not present

## 2023-08-06 DIAGNOSIS — Z0000 Encounter for general adult medical examination without abnormal findings: Secondary | ICD-10-CM | POA: Diagnosis not present

## 2023-08-08 ENCOUNTER — Ambulatory Visit (INDEPENDENT_AMBULATORY_CARE_PROVIDER_SITE_OTHER): Payer: 59 | Admitting: Podiatry

## 2023-08-08 ENCOUNTER — Encounter: Payer: Self-pay | Admitting: Podiatry

## 2023-08-08 DIAGNOSIS — M722 Plantar fascial fibromatosis: Secondary | ICD-10-CM

## 2023-08-08 DIAGNOSIS — M7662 Achilles tendinitis, left leg: Secondary | ICD-10-CM | POA: Diagnosis not present

## 2023-08-08 NOTE — Progress Notes (Signed)
  Subjective:  Patient ID: Robert Hess, male    DOB: [birth year],  MRN: [id number]  Chief Complaint  Patient presents with   Tendonitis    Left Achilles, wearing boot, prednisone is finished, but he got no relief from it or the last injection. He did gain 20 lbs on the prednisone, PCP is unhappy with the weight gain. Last A1c was 5.2 but was drawn right after finshed pred. No anticoags.     43 y.o. male presents with the above complaint.  He continues to endorse pain to the left posterior inferior lateral heel.  Reports that his symptoms at this location have been unchanged.  He is frustrated by this.  He has been wearing the boot.  Reports weight gain with prednisone.  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   Review of Systems: Negative except as noted in the HPI. Denies N/V/F/Ch.   Objective:  There were no vitals filed for this visit. There is no height or weight on file to calculate BMI. Constitutional Well developed. Well nourished.  Vascular Dorsalis pedis pulses palpable bilaterally. Posterior tibial pulses palpable bilaterally. Capillary refill normal to all digits.  No cyanosis or clubbing noted. Pedal hair growth normal.  Neurologic Normal speech. Oriented to person, place, and time. Epicritic sensation to light touch grossly present bilaterally.  Dermatologic Nails well groomed and normal in appearance. No open wounds. No skin lesions.  Orthopedic: Normal joint ROM without pain or crepitus bilaterally. No visible deformities. Tender to palpation at the calcaneal tuber left posteroirly, and plantarly. No pain on palpation of plantar medial calcaneal tuberosity today, no palpation of the Achilles tendon today. No pain with calcaneal squeeze left. Ankle ROM diminished range of motion left. Silfverskiold Test: negative left.   Radiographs: Deferred today  Assessment:   1.  Achilles tendinitis of left lower extremity   2. Plantar fasciitis of left foot    Plan:  Patient was evaluated and treated and all questions answered.  Achilles tendinitis, Plantar Fasciitis, left -Has not had any improvement with immobilization since last visit -Okay to transition to regular shoe gears with heel lifts for now, avoid high impact activity - Will order MRI to further assess exact location etiology of patient's pain -Deferring further steroid injections or prescription oral medication at this point -Patient does state that he would like to avoid surgical treatment if at all possible.  Return if symptoms worsen or fail to improve, for Plantar Fasciitis,Achilles tendinitis, follow up after MRI results.

## 2023-08-21 ENCOUNTER — Encounter: Payer: Self-pay | Admitting: Podiatry

## 2023-08-23 ENCOUNTER — Encounter: Payer: Self-pay | Admitting: Podiatry

## 2023-08-26 ENCOUNTER — Encounter: Payer: Self-pay | Admitting: Podiatry

## 2023-08-27 ENCOUNTER — Encounter: Payer: Self-pay | Admitting: Podiatry

## 2023-08-28 ENCOUNTER — Telehealth: Payer: Self-pay

## 2023-08-28 ENCOUNTER — Encounter: Payer: Self-pay | Admitting: Podiatry

## 2023-08-28 NOTE — Telephone Encounter (Signed)
 I tried calling patient and call could not be completed as dialed

## 2023-08-28 NOTE — Telephone Encounter (Signed)
 Patient called and left a message - MRI denied -[facility] is going to cancel appt on Monday - patient is upset, stating if we cannot resolve this, he will be leaving the practice-  [facility] told him that insurance did not get any clinical information with the prior auth - Please call [phone], case # [id number]  Please call patient to let him know outcome [phone]

## 2023-08-28 NOTE — Telephone Encounter (Signed)
 I spoke with Cathlean Cower at [facility] and clinicals were received 08/23/23 and case has been placed in medical director review.

## 2023-09-02 ENCOUNTER — Other Ambulatory Visit: Payer: 59

## 2023-09-06 ENCOUNTER — Ambulatory Visit
Admission: RE | Admit: 2023-09-06 | Discharge: 2023-09-06 | Disposition: A | Discharge status: Home / Self Care | Payer: 59 | Source: Ambulatory Visit | Attending: Podiatry | Admitting: Podiatry

## 2023-09-06 DIAGNOSIS — M79672 Pain in left foot: Secondary | ICD-10-CM | POA: Diagnosis not present

## 2023-09-06 DIAGNOSIS — R600 Localized edema: Secondary | ICD-10-CM | POA: Diagnosis not present

## 2023-09-06 DIAGNOSIS — M7662 Achilles tendinitis, left leg: Secondary | ICD-10-CM

## 2023-09-23 DIAGNOSIS — J029 Acute pharyngitis, unspecified: Secondary | ICD-10-CM | POA: Diagnosis not present

## 2023-09-23 DIAGNOSIS — R509 Fever, unspecified: Secondary | ICD-10-CM | POA: Diagnosis not present

## 2023-09-23 DIAGNOSIS — J020 Streptococcal pharyngitis: Secondary | ICD-10-CM | POA: Diagnosis not present

## 2023-12-19 DIAGNOSIS — G3184 Mild cognitive impairment, so stated: Secondary | ICD-10-CM | POA: Diagnosis not present

## 2024-01-23 DIAGNOSIS — F0630 Mood disorder due to known physiological condition, unspecified: Secondary | ICD-10-CM | POA: Diagnosis not present

## 2024-01-23 DIAGNOSIS — F4323 Adjustment disorder with mixed anxiety and depressed mood: Secondary | ICD-10-CM | POA: Diagnosis not present

## 2024-02-13 DIAGNOSIS — G3184 Mild cognitive impairment, so stated: Secondary | ICD-10-CM | POA: Diagnosis not present

## 2024-02-13 DIAGNOSIS — F3181 Bipolar II disorder: Secondary | ICD-10-CM | POA: Diagnosis not present

## 2024-02-13 DIAGNOSIS — F322 Major depressive disorder, single episode, severe without psychotic features: Secondary | ICD-10-CM | POA: Diagnosis not present

## 2024-02-27 DIAGNOSIS — F0630 Mood disorder due to known physiological condition, unspecified: Secondary | ICD-10-CM | POA: Diagnosis not present

## 2024-02-27 DIAGNOSIS — G3184 Mild cognitive impairment, so stated: Secondary | ICD-10-CM | POA: Diagnosis not present

## 2024-02-27 DIAGNOSIS — F332 Major depressive disorder, recurrent severe without psychotic features: Secondary | ICD-10-CM | POA: Diagnosis not present

## 2024-03-02 DIAGNOSIS — M722 Plantar fascial fibromatosis: Secondary | ICD-10-CM | POA: Diagnosis not present

## 2024-03-05 DIAGNOSIS — M722 Plantar fascial fibromatosis: Secondary | ICD-10-CM | POA: Diagnosis not present
